# Patient Record
Sex: Female | Born: 1940 | Race: White | Hispanic: No | Smoking: Current every day smoker
Health system: Southern US, Community
[De-identification: ages and names within clinical notes are randomized; demographics above are authoritative.]

## PROBLEM LIST (undated history)

## (undated) DIAGNOSIS — M549 Dorsalgia, unspecified: Secondary | ICD-10-CM

## (undated) DIAGNOSIS — S39012A Strain of muscle, fascia and tendon of lower back, initial encounter: Secondary | ICD-10-CM

## (undated) DIAGNOSIS — M4800 Spinal stenosis, site unspecified: Secondary | ICD-10-CM

## (undated) DIAGNOSIS — J449 Chronic obstructive pulmonary disease, unspecified: Secondary | ICD-10-CM

## (undated) DIAGNOSIS — E785 Hyperlipidemia, unspecified: Secondary | ICD-10-CM

## (undated) DIAGNOSIS — F419 Anxiety disorder, unspecified: Secondary | ICD-10-CM

## (undated) DIAGNOSIS — I714 Abdominal aortic aneurysm, without rupture, unspecified: Secondary | ICD-10-CM

## (undated) HISTORY — PX: ABDOMINAL HYSTERECTOMY: SHX81

## (undated) HISTORY — DX: Dorsalgia, unspecified: M54.9

## (undated) HISTORY — DX: Anxiety disorder, unspecified: F41.9

## (undated) HISTORY — DX: Abdominal aortic aneurysm, without rupture, unspecified: I71.40

## (undated) HISTORY — DX: Strain of muscle, fascia and tendon of lower back, initial encounter: S39.012A

## (undated) HISTORY — DX: Spinal stenosis, site unspecified: M48.00

## (undated) HISTORY — DX: Hyperlipidemia, unspecified: E78.5

## (undated) HISTORY — PX: OTHER SURGICAL HISTORY: SHX169

## (undated) HISTORY — DX: Chronic obstructive pulmonary disease, unspecified: J44.9

## (undated) HISTORY — PX: GALLBLADDER SURGERY: SHX652

## (undated) HISTORY — DX: Abdominal aortic aneurysm, without rupture: I71.4

## (undated) HISTORY — PX: TONSILLECTOMY: SUR1361

---

## 2001-07-17 ENCOUNTER — Encounter: Payer: Self-pay | Admitting: Neurology

## 2001-07-17 ENCOUNTER — Ambulatory Visit (HOSPITAL_COMMUNITY): Admission: RE | Admit: 2001-07-17 | Discharge: 2001-07-17 | Payer: Self-pay | Admitting: Neurology

## 2015-11-24 ENCOUNTER — Other Ambulatory Visit: Payer: Self-pay

## 2015-11-24 ENCOUNTER — Encounter: Payer: Self-pay | Admitting: Surgery

## 2015-11-24 DIAGNOSIS — I739 Peripheral vascular disease, unspecified: Secondary | ICD-10-CM

## 2015-11-26 ENCOUNTER — Telehealth: Payer: Self-pay | Admitting: Vascular Surgery

## 2015-11-26 NOTE — Telephone Encounter (Signed)
-----   Message from Phillips Odorarol S Pullins, RN sent at 11/26/2015 11:28 AM EDT ----- Contact: [phone] This call got sent to Triage; she is referred by Dr. Shelle IronBeane, and is requesting to be seen sooner.  Can you check on a cancellation please.

## 2015-11-26 NOTE — Telephone Encounter (Signed)
dr. Shelle Ironbeane called triage to see if we could get pt in sooner, resched md appt from 5/1 to 4/27, spoke to pt's daughter to inform pt of new appt.

## 2015-12-01 ENCOUNTER — Ambulatory Visit (HOSPITAL_COMMUNITY)
Admission: RE | Admit: 2015-12-01 | Discharge: 2015-12-01 | Disposition: A | Discharge status: Home / Self Care | Payer: Medicare Other | Source: Ambulatory Visit | Attending: Vascular Surgery | Admitting: Vascular Surgery

## 2015-12-01 ENCOUNTER — Other Ambulatory Visit (HOSPITAL_COMMUNITY): Payer: Self-pay | Admitting: Specialist

## 2015-12-01 ENCOUNTER — Other Ambulatory Visit: Payer: Self-pay | Admitting: Surgery

## 2015-12-01 DIAGNOSIS — F419 Anxiety disorder, unspecified: Secondary | ICD-10-CM | POA: Insufficient documentation

## 2015-12-01 DIAGNOSIS — E785 Hyperlipidemia, unspecified: Secondary | ICD-10-CM | POA: Insufficient documentation

## 2015-12-01 DIAGNOSIS — I739 Peripheral vascular disease, unspecified: Secondary | ICD-10-CM

## 2015-12-01 DIAGNOSIS — M79606 Pain in leg, unspecified: Secondary | ICD-10-CM

## 2015-12-01 DIAGNOSIS — R0989 Other specified symptoms and signs involving the circulatory and respiratory systems: Secondary | ICD-10-CM | POA: Diagnosis present

## 2015-12-02 ENCOUNTER — Encounter (HOSPITAL_COMMUNITY): Payer: Medicare Other

## 2015-12-03 ENCOUNTER — Encounter: Payer: Self-pay | Admitting: Surgery

## 2015-12-11 ENCOUNTER — Encounter: Payer: Self-pay | Admitting: Vascular Surgery

## 2015-12-11 ENCOUNTER — Ambulatory Visit (INDEPENDENT_AMBULATORY_CARE_PROVIDER_SITE_OTHER): Payer: Medicare Other | Admitting: Vascular Surgery

## 2015-12-11 VITALS — BP 124/78 | HR 71 | Ht 61.0 in | Wt 133.0 lb

## 2015-12-11 DIAGNOSIS — I714 Abdominal aortic aneurysm, without rupture, unspecified: Secondary | ICD-10-CM

## 2015-12-11 DIAGNOSIS — I739 Peripheral vascular disease, unspecified: Secondary | ICD-10-CM

## 2015-12-11 NOTE — Progress Notes (Signed)
HISTORY AND PHYSICAL     CC:  "I think I have a blood flow problem" Referring Provider:  Dr. Shelle Iron  HPI: This is a 75 y.o. female who states she had pneumonia ~ a year ago.  She states she took the Pneumovax vaccine and got pneumonia again.  She states she had a coughing spell and felt something pull in her back on the left side near her buttocks.  She said over the next few days it got worse.  She has gotten to the point where she is having pain in her buttocks that radiates down her left leg.  She describes this a tingling.   She states that it can happen at any time and continues to get worse.  She states now that she is using a cane with her right hand, it is hurting her right shoulder, which she has had surgery on in the past.  She states that before all this, she did not get any cramping in her legs when walking.  She does not have any non healing wounds.  She states that she has been sent for epidural steroid injections, but she has no relief with this.    She has seen Dr. Shelle Iron and She she does have some lumbar spondylosis.  His note reveals she has an infrarenal AAA and calcification of the right iliac vessels.  He has referred her here for ABI's to check blood flow.  He states that if her ABI's are normal, a CT myelogram can be done. MRI in January 2017 revealed that the AAA measured 4.cm at the maximum diameter and not significantly changed since last exam.  She did have a CT scan ~ 1 year ago revealing a 4cm AAA.  She does not have any abdominal pain.   She states that she does smoke 2ppd of cigarettes.  She really has not tried to quit.    She does take a statin for cholesterol management.  She is on Neurontin.    Past Medical History  Diagnosis Date  . Spinal stenosis   . Lumbar strain   . Anxiety disorder   . COPD (chronic obstructive pulmonary disease) (HCC)   . Hyperlipidemia   . AAA (abdominal aortic aneurysm) [facility])     Past Surgical History  Procedure Laterality Date    . Oophorectomy Left   . Gallbladder surgery    . Abdominal hysterectomy    . Tonsillectomy      No Known Allergies  Current Outpatient Prescriptions  Medication Sig Dispense Refill  . acetaminophen (TYLENOL) 650 MG CR tablet Take 650 mg by mouth every 8 (eight) hours as needed for pain.    . Omega-3 Fatty Acids (FISH OIL) 1000 MG CAPS Take by mouth.    . ALPRAZolam (XANAX) 0.5 MG tablet Take 0.5 mg by mouth at bedtime as needed for anxiety.    . ATORVASTATIN CALCIUM PO Take by mouth.    Marland Kitchen CALCIUM PO Take by mouth.    . Cholecalciferol (VITAMIN D-3 PO) Take by mouth.    . diazepam (VALIUM) 10 MG tablet Take 10 mg by mouth every 6 (six) hours as needed for anxiety.    . dicyclomine (BENTYL) 10 MG capsule Take 10 mg by mouth 4 (four) times daily -  before meals and at bedtime.    . gabapentin (NEURONTIN) 100 MG capsule Take 100 mg by mouth.    . ondansetron (ZOFRAN) 4 MG tablet Take 4 mg by mouth every 8 (eight) hours as  needed for nausea or vomiting.    . ondansetron (ZOFRAN) 8 MG tablet Take 8 mg by mouth every 8 (eight) hours as needed for nausea or vomiting.    . terbinafine (LAMISIL) 250 MG tablet Take 250 mg by mouth daily. Reported on 12/11/2015     No current facility-administered medications for this visit.    Family History  Problem Relation Age of Onset  . Heart disease Daughter     Social History   Social History  . Marital Status: Widowed    Spouse Name: N/A  . Number of Children: N/A  . Years of Education: N/A   Occupational History  . Not on file.   Social History Main Topics  . Smoking status: Current Every Day Smoker -- 2.00 packs/day    Types: Cigarettes  . Smokeless tobacco: Not on file  . Alcohol Use: No  . Drug Use: No  . Sexual Activity: Not on file   Other Topics Concern  . Not on file   Social History Narrative     REVIEW OF SYSTEMS:   [X]  denotes positive finding, [ ]  denotes negative finding Cardiac  Comments:  Chest pain or chest  pressure:    Shortness of breath upon exertion:    Short of breath when lying flat:    Irregular heart rhythm:        Vascular    Pain in calf, thigh, or hip brought on by ambulation: x   Pain in feet at night that wakes you up from your sleep:     Blood clot in your veins:    Leg swelling:         Pulmonary    Oxygen at home:    Productive cough:     COPD/emphysema x   Wheezing:         Neurologic    Sudden weakness in arms or legs:  x   Sudden tingling in left leg    Sudden onset of difficulty speaking or slurred speech:    Temporary loss of vision in one eye:     Problems with dizziness:         Gastrointestinal    Blood in stool:     Vomited blood:         Genitourinary    Burning when urinating:     Blood in urine:        Psychiatric    Major depression:         Hematologic    Bleeding problems:    Problems with blood clotting too easily:        Skin    Rashes or ulcers:        Constitutional    Fever or chills:      PHYSICAL EXAMINATION:  Filed Vitals:   12/11/15 1504  BP: 124/78  Pulse: 71   Body mass index is 25.14 kg/(m^2).  General:  WDWN in NAD; vital signs documented above Gait: Not observed HENT: WNL, normocephalic Pulmonary: normal non-labored breathing , without Rales, rhonchi,  wheezing Cardiac: regular HR, without  Murmurs, rubs or gallops; without carotid bruits Abdomen: soft, NT, no masses Skin: without rashes Vascular Exam/Pulses:  Right Left  Radial 2+ (normal) 2+ (normal)  Ulnar 1+ (weak) 1+ (weak)  Femoral 2+ (normal) 2+ (normal)  Popliteal Unable to palpate  Unable to palpate   DP Unable to palpate  Unable to palpate   PT Unable to palpate  Unable to palpate    Extremities: without  ischemic changes, without Gangrene , without cellulitis; without open wounds;  Musculoskeletal: no muscle wasting or atrophy  Neurologic: A&O X 3;  No focal weakness or paresthesias are detected Psychiatric:  The pt has Normal  affect.   Non-Invasive Vascular Imaging:   ABI's 12/01/15: Right:  1.03 Left:  0.75  Pt meds includes: Statin:  Yes.   Beta Blocker:  No. Aspirin:  No. ACEI:  No. ARB:  No. Other Antiplatelet/Anticoagulant:  No.    ASSESSMENT/PLAN:: 75 y.o. female with left lower back pain that radiates to left leg   -pt's ABI's are 1.03 on the right and 0.75 on the left.  Dr. Darrick Penna does not think this is the source of her buttock pain as she does have 2+ bilateral femoral pulses -she does have a 4cm AAA that has been essentially unchanged over the past year on imaging.  She does not have any abdominal pain.   -she does continue to smoke ~ 2ppd of cigarettes-Dr. Darrick Penna did counsel her on smoking cessation and its importance.   -She is also encouraged to walk 30 minutes daily -she will f/u in 6 months with u/s of AAA and ABI's  -will refer her to Dr. Adella Hare with neurology in [place].   Doreatha Massed, PA-C [facility] [phone]  Clinic MD:  Pt seen and examined in conjunction with Dr. Darrick Penna  History and exam findings are as above. Patient gives a vague history of pain in her left buttocks and left leg when she walks. However this is not reliably reproducible at any certain distance which usually is the case with claudication. She most likely has multifactorial reasons for her left leg pain including her back and some mild peripheral arterial occlusive disease. She does not have any wounds on her feet. She currently has adequate perfusion to her left lower extremity that she is not at risk of limb loss. I discussed with her today walking 30 minutes daily which could improve her back symptoms as well as any possible peripheral arterial disease symptoms. I also discussed with her smoking cessation and greater than 3 minutes they were spent regarding smoking cessation counseling.  The patient also has a known 4 cm abdominal aortic aneurysm which will need to be followed over  time. We will repeat an ultrasound of this in 6 months time. I discussed with the patient and her daughter that a 4 cm abdominal aortic aneurysm has very low risk of rupture and that an intervention at this size is not necessary due to this very low risk. Also discussed with them that we wait risk of rupture versus risk of operation and currently risk of operation outweighs this. I also discussed with the patient that if she is able to quit smoking and walk 30 minutes daily 75% of patients will improve her symptoms with this alone.  The patient will follow-up with me in 6 months time for repeat ultrasound of her abdominal aortic aneurysm as well as ABIs to make sure that she is not getting worse over time.  The patient also requested a referral to Dr. Adella Hare with neurology in [place] who apparently she has seen before. She requested to see Dr. Adella Hare to see if he had any suggestions on how to improve her pain symptoms from a neurologic standpoint.  Fabienne Bruns, MD [facility] [place] Office: [phone] Pager: [phone]

## 2015-12-12 ENCOUNTER — Encounter: Payer: Self-pay | Admitting: Vascular Surgery

## 2015-12-15 ENCOUNTER — Encounter: Payer: Medicare Other | Admitting: Surgery

## 2015-12-15 ENCOUNTER — Encounter (HOSPITAL_COMMUNITY): Payer: Medicare Other

## 2015-12-17 NOTE — Addendum Note (Signed)
Addended by: Sharee PimpleMCCHESNEY, MARILYN K on: 12/17/2015 12:56 PM   Modules accepted: Orders

## 2015-12-31 ENCOUNTER — Ambulatory Visit (INDEPENDENT_AMBULATORY_CARE_PROVIDER_SITE_OTHER): Payer: Medicare Other | Admitting: Neurology

## 2015-12-31 ENCOUNTER — Encounter: Payer: Self-pay | Admitting: Neurology

## 2015-12-31 VITALS — BP 130/63 | HR 67 | Ht 61.0 in | Wt 136.0 lb

## 2015-12-31 DIAGNOSIS — M5442 Lumbago with sciatica, left side: Secondary | ICD-10-CM

## 2015-12-31 DIAGNOSIS — Z13820 Encounter for screening for osteoporosis: Secondary | ICD-10-CM

## 2015-12-31 MED ORDER — FENTANYL 25 MCG/HR TD PT72: 25.0000 ug | MEDICATED_PATCH | TRANSDERMAL | Status: DC

## 2015-12-31 NOTE — Progress Notes (Addendum)
PATIENT: Tiffany Horne DOB: 1940/12/31  Chief Complaint  Patient presents with  . Lumbar Radiculitis    She is here with her daughter, Tiffany Horne.  Reports having low back pain that is radiating into her bilateral legs.  She has [facility] to [place]. She has been evaluated at [facility] by Dr. Shelle Iron.  She had an abnormal MRI and underwent a series of three epidural steroid injections.  The injections were not helpful for her pain.  She has also had a normal vascular exam.  She is currently using hydrocodone 5-325mg , taking 0.5 tablet every four hours then 1.5 tablets at bedtime.     HISTORICAL  Tiffany Horne is a 75 years old ambidextrous female, accompanied by her daughter Tiffany Horne, seen in refer by orthopedic surgeon Dr. Jene Every for evaluation of chronic low back pain, radiating pain to bilateral lower extremity, her primary care physician is nurse practitioner Hal Morales  She suffered severe recurrent pneumonia in the winter of 2016, during that period of time, she coughed violently, in November 75, during one intense coughing spell, she felt instant low back pain, heard a pop in her low back, since then, she noticed worsening low back pain, radiating pain to her left hip, left leg, difficulty walking, difficult to find a comfortable position to sleep, she is now sleeping the seated position, rely on multiple dose of opiates for pain control.  She used to be a Horticulturist, commercial, very active prior to this incident, she was a heavy smoker for more than 30 years,  Over the past few month, she was treated by chiropractor, manipulation has made her low back worse, now she ambulate with a cane, the pain began to radiate into both legs, left worse than right. When she walks with a left ankle dorsi flexion, she felt worsening left-sided low back pain.  She denies neck pain, no bowel and bladder incontinence  I personally reviewed MRI of lumbar and x-ray of lumbar, MRI in January 2017  showed mild lumbar levosclerosis, multilevel degenerative disc disease, with mild left lateral recess stenosis at L3-4, L4-5 level, AAA measured 4.cm at the maximum diameter and not significantly changed since last exam.  Doppler study of bilateral lower extremity April 2017 showed no significant abnormality  REVIEW OF SYSTEMS: Full 14 system review of systems performed and notable only for fatigue, swelling legs, anxiety, not enough sleep, decreased energy, insomnia, restless leg  ALLERGIES: No Known Allergies  HOME MEDICATIONS: Current Outpatient Prescriptions  Medication Sig Dispense Refill  . acetaminophen (TYLENOL) 650 MG CR tablet Take 650 mg by mouth every 8 (eight) hours as needed for pain.    Marland Kitchen ALPRAZolam (XANAX) 0.5 MG tablet Take 0.5 mg by mouth at bedtime as needed for anxiety.    . ATORVASTATIN CALCIUM PO Take by mouth.    Marland Kitchen CALCIUM PO Take by mouth.    . Cholecalciferol (VITAMIN D-3 PO) Take by mouth.    . diazepam (VALIUM) 10 MG tablet Take 10 mg by mouth every 6 (six) hours as needed for anxiety.    . gabapentin (NEURONTIN) 100 MG capsule Take 100 mg by mouth.    Marland Kitchen HYDROcodone-acetaminophen (NORCO/VICODIN) 5-325 MG tablet     . Omega-3 Fatty Acids (FISH OIL) 1000 MG CAPS Take by mouth.    . ondansetron (ZOFRAN) 4 MG tablet Take 4 mg by mouth every 8 (eight) hours as needed for nausea or vomiting.    . ondansetron (ZOFRAN) 8 MG tablet Take  8 mg by mouth every 8 (eight) hours as needed for nausea or vomiting.    . terbinafine (LAMISIL) 250 MG tablet Take 250 mg by mouth daily. Reported on 12/11/2015     No current facility-administered medications for this visit.    PAST MEDICAL HISTORY: Past Medical History  Diagnosis Date  . Spinal stenosis   . Lumbar strain   . Anxiety disorder   . COPD (chronic obstructive pulmonary disease) (HCC)   . Hyperlipidemia   . AAA (abdominal aortic aneurysm) (HCC)   . Back pain     PAST SURGICAL HISTORY: Past Surgical History    Procedure Laterality Date  . Oophorectomy Left   . Gallbladder surgery    . Abdominal hysterectomy    . Tonsillectomy      FAMILY HISTORY: Family History  Problem Relation Age of Onset  . Heart disease Daughter   . Dementia Mother   . Heart attack Father     SOCIAL HISTORY:  Social History   Social History  . Marital Status: Widowed    Spouse Name: N/A  . Number of Children: 3  . Years of Education: 12   Occupational History  . Retired    Social History Main Topics  . Smoking status: Current Every Day Smoker -- 1.50 packs/day    Types: Cigarettes  . Smokeless tobacco: Not on file  . Alcohol Use: 0.0 oz/week    0 Standard drinks or equivalent per week     Comment: Occasionally  . Drug Use: No  . Sexual Activity: Not on file   Other Topics Concern  . Not on file   Social History Narrative   Lives at home alone (daughter often stays with her).   Right-handed.   No caffeine use.     PHYSICAL EXAM   Filed Vitals:   12/31/15 1326  BP: 130/63  Pulse: 67  Height: 5\' 1"  (1.549 m)  Weight: 136 lb (61.689 kg)    Not recorded      Body mass index is 25.71 kg/(m^2).  PHYSICAL EXAMNIATION:  Gen: NAD, conversant, well nourised, obese, well groomed                     Cardiovascular: Regular rate rhythm, no peripheral edema, warm, nontender. Eyes: Conjunctivae clear without exudates or hemorrhage Neck: Supple, no carotid bruise. Pulmonary: Clear to auscultation bilaterally   NEUROLOGICAL EXAM:  MENTAL STATUS: Speech:    Speech is normal; fluent and spontaneous with normal comprehension.  Cognition:     Orientation to time, place and person     Normal recent and remote memory     Normal Attention span and concentration     Normal Language, naming, repeating,spontaneous speech     Fund of knowledge   CRANIAL NERVES: CN II: Visual fields are full to confrontation. Fundoscopic exam is normal with sharp discs and no vascular changes. Pupils are round  equal and briskly reactive to light. CN III, IV, VI: extraocular movement are normal. No ptosis. CN V: Facial sensation is intact to pinprick in all 3 divisions bilaterally. Corneal responses are intact.  CN VII: Face is symmetric with normal eye closure and smile. CN VIII: Hearing is normal to rubbing fingers CN IX, X: Palate elevates symmetrically. Phonation is normal. CN XI: Head turning and shoulder shrug are intact CN XII: Tongue is midline with normal movements and no atrophy.  MOTOR: There is no pronator drift of out-stretched arms. Muscle bulk and tone are normal.  Muscle strength is normal.  REFLEXES: Reflexes are 2+ and symmetric at the biceps, triceps, knees, and ankles. Plantar responses are flexor.  SENSORY: Intact to light touch, pinprick, positional sensation and vibratory sensation are intact in fingers and toes.  COORDINATION: Rapid alternating movements and fine finger movements are intact. There is no dysmetria on finger-to-nose and heel-knee-shin.    GAIT/STANCE: Need assistant to get up from seated position, cautious, antalgic unsteady gait  DIAGNOSTIC DATA (LABS, IMAGING, TESTING) - I reviewed patient records, labs, notes, testing and imaging myself where available.   ASSESSMENT AND PLAN  Margie EgeBarbara A Villela is a 75 y.o. female   Acute worsening of low back pain radiating pain to left leg,   most consistent with a left lumbar radiculopathy  EMG nerve conduction study   she is at high risk for osteoporosis, ordered a bone density scan   we will repeat lumbar x-ray  Fentanyl patch for pain control, breakthrough pain, hydrocodone as needed   Levert FeinsteinYijun Landi Biscardi, M.D. Ph.D.  [facility] [address], [address] [place], [place] [address] Ph: [phone]) [phone] Fax: [phone]   CC: Jene EveryJeffrey Beane, MD, Hal Moralesara G Gunter, NP

## 2016-01-01 ENCOUNTER — Telehealth: Payer: Self-pay | Admitting: *Deleted

## 2016-01-01 ENCOUNTER — Other Ambulatory Visit: Payer: Self-pay | Admitting: *Deleted

## 2016-01-01 MED ORDER — HYDROCODONE-ACETAMINOPHEN 5-325 MG PO TABS: ORAL_TABLET | ORAL | Status: DC

## 2016-01-01 NOTE — Telephone Encounter (Signed)
Spoke to [place] (dgt on HIPPA) who states her mother is in extreme pain.  She just started wearing her first Fentanyl patch less than 12 hours ago.  I explained how Fentanyl patches work and that she would not be experiencing the full benefit of this medication just yet.  Her mother only has two tablets left of her hydrocodone-apap 5-325mg .  Dr. Terrace ArabiaYan has provided a prescription for 30 additional tablets with no refills.  Raynelle FanningJulie is aware the rx must be picked up from our office.  Also, the patient thought she was suppose to stop her gabapentin but Dr. Terrace ArabiaYan would like to her to continue this medication.

## 2016-01-01 NOTE — Telephone Encounter (Signed)
------------------------------------------------------------   Caller JULIE/DAUGHTER             CID [id number]  Patient Tiffany Horne       Pt's Dr Terrace ArabiaYAN          Area Code 336 Phone# [phone] * DOB [phone]      RE SEEN 5/17-HAVING DIFFICULTIES W/FINNIGRAN PATCH   AND IS IN EXTREME PAIN-PLS CB/CANCELLING 6/7 @2 :15   Disp:Y/N N If Y = C/B If No Response In 20minutes ============================================================

## 2016-01-06 ENCOUNTER — Telehealth: Payer: Self-pay | Admitting: Neurology

## 2016-01-06 NOTE — Telephone Encounter (Signed)
The patient has developed leg swelling bilaterally that is also red, painful. She is to see the primary MD about this. I do not think this is related to the fentanyl. This started before the patch was started. She has total body itching that is related to the Opiate medications. She may have to stop the Fentanyl because of this.

## 2016-01-07 ENCOUNTER — Telehealth: Payer: Self-pay | Admitting: Neurology

## 2016-01-07 ENCOUNTER — Telehealth: Payer: Self-pay | Admitting: *Deleted

## 2016-01-07 MED ORDER — HYDROCODONE-ACETAMINOPHEN 5-325 MG PO TABS: ORAL_TABLET | ORAL | Status: AC

## 2016-01-07 NOTE — Telephone Encounter (Signed)
Message For: O/C              (39)     Taken 23-MAY-17 at  7:26PM by [facility]AR Delivered 23-MAY-17 at  7:28PM by JAB to                ------------------------------------------------------------ [name]      CID [id number]  Patient Retia PasseBARBARA Dexter       Pt's Dr Terrace ArabiaYAN          Area Code 336 Phone# [phone] * DOB [phone]    RE LEGS ARE SWELLING UP W/ ITCHING, WORSE SINCE      "FITNOL" PATCHES TREATMENT BEGAN A WEEK AGO          Disp:Y/N Y If Y = C/B If No Response In 20minutes ============================================================

## 2016-01-07 NOTE — Telephone Encounter (Signed)
Daughter Babette Relicammy called to ask if brother Tiffany Horne "Tiffany Horne" could pick up patient's Rx tomorrow morning. He is not listed on DPR form, however he does have Healthcare POA that he can bring in the morning. Please call to advise [phone].

## 2016-01-07 NOTE — Telephone Encounter (Addendum)
Spoke to patient's daughter on HIPPA ([name]) - states her mother's itching has become intolerable even with antihistamines.  Per vo by Dr. Terrace ArabiaYan, discontinue the Fentanyl patches and provide rx for hydrocodone 5-325mg , every 8 hours - postdated from last rx and only enough to last until her appt for NVC/EMG on 01/20/16.  [name] is agreeable to this plan.  Hydrocodone rx placed up front for pick up.

## 2016-01-07 NOTE — Addendum Note (Signed)
Addended by: Lindell SparKIRKMAN, MICHELLE C on: 01/07/2016 04:02 PM   Modules accepted: Orders

## 2016-01-07 NOTE — Telephone Encounter (Addendum)
They will come by tomorrow to update the information. Patient's son, Tiffany Horne, will bring a copy of his healthcare POA to have scanned to patient's chart so he is able to pick up her prescriptions.

## 2016-01-07 NOTE — Telephone Encounter (Signed)
Pt's , Tiffany Horne, daughter called to let Dr. Terrace ArabiaYan know she has been giving pt an anti-histamine medication for all over itching. She wants to know if the dosage for fentaNYL (DURAGESIC) 25 MCG/HR patch could be increased. Pts pain is not being controlled. Pt has appt with her pcp as well. Please call [phone] cell

## 2016-01-07 NOTE — Telephone Encounter (Signed)
Duplicate task.

## 2016-01-09 ENCOUNTER — Telehealth: Payer: Self-pay | Admitting: Neurology

## 2016-01-09 NOTE — Telephone Encounter (Signed)
I was contacted by the radiologist today, the AAA may be significantly larger that noted in April 2017, ? Cause of the back pain. Will get a CT of the Abdomen if OK with the patient. I talked with the daughter, she will discuss with the patient and call me back.

## 2016-01-13 NOTE — Telephone Encounter (Addendum)
Daughter Tiffany Horne, returned Dr. Clarisa KindredWillis's call from Friday, regarding "Aorta has grown 6 cm's since January, was 4.3 in January", daughter asks "is this what's causing her to not be able to walk"?. Please call [phone].

## 2016-01-13 NOTE — Telephone Encounter (Signed)
Reviewed, I will see patient in January 20 2016

## 2016-01-13 NOTE — Addendum Note (Signed)
Addended by: Sharee PimpleMCCHESNEY, MARILYN K on: 01/13/2016 05:18 PM   Modules accepted: Orders

## 2016-01-13 NOTE — Telephone Encounter (Signed)
I called the patient. I talk with the daughter. The radiologist called me about the x-ray the low back, there is some question of enlargement of the abdominal aortic aneurysm. He recommended confirmation with a noncontrast CT scan of the abdomen. The daughter wonders whether they should call Dr. Darrick PennaFields to get his opinion regarding the abdominal CT scan. I think this is quite reasonable, at this point, I'll leave it between the patient and Dr. Darrick PennaFields to decide whether or not to do the abdominal CT.

## 2016-01-14 ENCOUNTER — Telehealth: Payer: Self-pay

## 2016-01-14 DIAGNOSIS — I714 Abdominal aortic aneurysm, without rupture, unspecified: Secondary | ICD-10-CM

## 2016-01-14 NOTE — Telephone Encounter (Signed)
rec'd phone call from pt's daughter.  Reported that the Neurologist ordered an xray of her lumbar spine, and noted that the AAA has increased in size from 4.3 to 4.9 cm.  Requested an appt. with a different vascular surgeon; stated her mother and Dr. Darrick PennaFields did not mesh.  Is requesting an appt. ASAP.  Stated the pt. Has had c/o of back pain for awhile, and has bee to a Chiropracter, an Orthopedist, Dr. Darrick PennaFields in our office, and recently, a Insurance account managereurologist.  Stated her mother can't lay down at night to rest.  Reported has to sleep sitting up, and can only get about 1-2 hrs. Sleep, at a time.  Discussed with Dr. Arbie CookeyEarly.  Recommended an abdominal ultrasound to eval. Size of AAA, and office visit.  Notified pt's daughter that the lumbar spine xray would not give accurate measurement of the AAA, and of Dr. Bosie HelperEarly's recommendation.  Agreed.  Is requesting the appt. be made ASAP.  Reported her mother c/o intermittent abdominal tightening, and then easing up.  Stated she denied abdominal pain associated with the abdominal tightening.  Advised a scheduler will contact her with an appt. For the pt.

## 2016-01-19 ENCOUNTER — Telehealth: Payer: Self-pay | Admitting: Neurology

## 2016-01-19 ENCOUNTER — Telehealth: Payer: Self-pay | Admitting: Vascular Surgery

## 2016-01-19 NOTE — Telephone Encounter (Signed)
-----   Message from Phillips Odorarol S Pullins, RN sent at 01/14/2016  4:30 PM EDT ----- Regarding: RE: AAA options I will call the daughter and reiterate the need to have her to lay down for accurate assessment of the AAA.  I'll let you know what the next step is.Marland Kitchen.Marland Kitchen.Dr. Arbie CookeyEarly agreed that it is either an Abd. US or CTA, and both she would need to lay down.   ----- Message -----    From: Jena GaussMichele A Roczniak    Sent: 01/14/2016   3:39 PM      To: Phillips Odorarol S Pullins, RN Subject: RE: AAA options                                I understand. The images from the spine films would be the only ones we could have.   Office visit only?  ----- Message -----    From: Phillips Odorarol S Pullins, RN    Sent: 01/14/2016   3:18 PM      To: Sallyanne KusterMichele A Roczniak Subject: RE: AAA options                                That is the only way to evaluate the size of the aneurysm; no other options; even with a CTA, the pt. would have to lay down.  It will not be an accurate evaluation of the size, with the pt. sitting up.    ----- Message -----    From: Jena GaussMichele A Roczniak    Sent: 01/14/2016   2:11 PM      To: Smitty Pluckarol S Pullins, RN Subject: AAA options                                    I spoke with the pt's daughter and she told me the pt can not lay down, she can not recline at all. We wouldn't be able to do a AAA study on her.  What are our options?  Thank you, Elon JesterMichele

## 2016-01-19 NOTE — Telephone Encounter (Signed)
The daughter has not discussed sedation with me in the past.  I actually, just [facility], rescheduled her NCV/EMG to be performed here on 01/20/16 by Dr. Terrace ArabiaYan.  I spoke to [name] again - she is concerned about her mother's pain level - she has hydrocodone at home that is currently providing her relief.  They will keep her pending appointment for the test here.

## 2016-01-19 NOTE — Telephone Encounter (Signed)
Sched lab w/ Olegario MessierKathy on 6/6 at 8:00. Sched md 6/13 at 1:45. Spoke to pt's daughter to inform them of appts.

## 2016-01-19 NOTE — Telephone Encounter (Signed)
-----   Message from Phillips Odorarol S Pullins, RN sent at 01/15/2016 11:49 AM EDT ----- Regarding: needs abd U/S and appt. with [facility] Contact: [phone] I spoke with pt's daughter, then asked Dr. Arbie CookeyEarly about poss. sedation for assisting this pt. to lay down for testing.  Per Dr. Arbie CookeyEarly, he didn't feel sedation was the way to go, so he [facility], and she will perform the test with the pt. sitting up.  Please check with her or Juliette AlcideMelinda, in scheduling this test, since she will need to be the one performing it.  Dr. Arbie CookeyEarly has agreed to see her in the office. Thanks for all your effort!

## 2016-01-19 NOTE — Telephone Encounter (Signed)
Pt' daughter called inquiring about r[facility] to [facility]. Sts pt can not lie down for the length of the test and is wanting her to be sedated at the hospital for the test. Pt said she had discussed this with Marcelino DusterMichelle. Please call

## 2016-01-19 NOTE — Telephone Encounter (Signed)
-----   Message from CIGNAKathrine A Comfort, RVT sent at 01/15/2016  4:07 PM EDT ----- Regarding: RE: needs abd U/S and appt. with [facility] Contact: [phone] I am available Tuesday the 6th at 8:00 am.  Janann AugustHelene can scan the DVT study on room 2 and I could scan this patient.  Olegario MessierKathy  ----- Message -----    From: Jena GaussMichele A Roczniak    Sent: 01/15/2016   3:32 PM      To: Caryl PinaKathrine A Comfort, RVT Subject: FW: needs abd U/S and appt. with T[facility]           Hi Olegario MessierKathy,  When would be the best time for you to sch this pt?  ----- Message -----    From: Phillips Odorarol S Pullins, RN    Sent: 01/15/2016  11:49 AM      To: Michele A Roczniak Subject: needs abd U/S and appt. with [facility]               I spoke with pt's daughter, then asked Dr. Arbie CookeyEarly about poss. sedation for assisting this pt. to lay down for testing.  Per Dr. Arbie CookeyEarly, he didn't feel sedation was the way to go, so [facility], and she will perform the test with the pt. sitting up.  Please check with her or Juliette AlcideMelinda, in scheduling this test, since she will need to be the one performing it.  Dr. Arbie CookeyEarly has agreed to see her in the office. Thanks for all your effort!

## 2016-01-20 ENCOUNTER — Ambulatory Visit (INDEPENDENT_AMBULATORY_CARE_PROVIDER_SITE_OTHER): Payer: Medicare Other | Admitting: Neurology

## 2016-01-20 ENCOUNTER — Ambulatory Visit (HOSPITAL_COMMUNITY)
Admission: RE | Admit: 2016-01-20 | Discharge: 2016-01-20 | Disposition: A | Discharge status: Home / Self Care | Payer: Medicare Other | Source: Ambulatory Visit | Attending: Vascular Surgery | Admitting: Vascular Surgery

## 2016-01-20 ENCOUNTER — Ambulatory Visit: Payer: Self-pay | Admitting: Neurology

## 2016-01-20 DIAGNOSIS — E785 Hyperlipidemia, unspecified: Secondary | ICD-10-CM | POA: Insufficient documentation

## 2016-01-20 DIAGNOSIS — F419 Anxiety disorder, unspecified: Secondary | ICD-10-CM | POA: Diagnosis not present

## 2016-01-20 DIAGNOSIS — M5442 Lumbago with sciatica, left side: Secondary | ICD-10-CM

## 2016-01-20 DIAGNOSIS — I714 Abdominal aortic aneurysm, without rupture, unspecified: Secondary | ICD-10-CM

## 2016-01-20 DIAGNOSIS — Z13820 Encounter for screening for osteoporosis: Secondary | ICD-10-CM | POA: Diagnosis not present

## 2016-01-20 MED ORDER — DULOXETINE HCL 30 MG PO CPEP: 60.0000 mg | ORAL_CAPSULE | Freq: Every day | ORAL | Status: AC

## 2016-01-20 NOTE — Progress Notes (Signed)
PATIENT: Tiffany Horne DOB: 01-01-1941  No chief complaint on file.    HISTORICAL  Tiffany Horne is a 75 years old ambidextrous female, accompanied by her daughter Babette Relic, seen in refer by orthopedic surgeon Dr. Jene Every for evaluation of chronic low back pain, radiating pain to bilateral lower extremity, her primary care physician is nurse practitioner Hal Morales  She suffered severe recurrent pneumonia in the winter of 2016, during that period of time, she coughed violently, in November 2016, during one intense coughing spell, she felt instant low back pain, heard a pop in her low back, since then, she noticed worsening low back pain, radiating pain to her left hip, left leg, difficulty walking, difficult to find a comfortable position to sleep, she is now sleeping the seated position, rely on multiple dose of opiates for pain control.  She used to be a Horticulturist, commercial, very active prior to this incident, she was a heavy smoker for more than 30 years,  Over the past few month, she was treated by chiropractor, manipulation has made her low back worse, now she ambulate with a cane, the pain began to radiate into both legs, left worse than right. When she walks with a left ankle dorsi flexion, she felt worsening left-sided low back pain.  She denies neck pain, no bowel and bladder incontinence  I personally reviewed MRI of lumbar and x-ray of lumbar, MRI in January 2017 showed mild lumbar levosclerosis, multilevel degenerative disc disease, with mild left lateral recess stenosis at L3-4, L4-5 level, AAA measured 4.cm at the maximum diameter and not significantly changed since last exam.  Doppler study of bilateral lower extremity April 2017 showed no significant abnormality  UPDATE June 6th 2017: She has tried fentanyl patch 25 g every 3 days, without helping her symptoms, she is now taking hydrocodone/Tylenol 5/325 mg 3 times a day, which was not enough, she could not tolerate  gabapentin 300 mg 3 times a day, cause bilateral lower extremity swelling, which did improve after stopping gabapentin and also diuretic,  She also has difficulty sleeping, is taking Tylenol PM as needed, she is also taking Valium 10 mg as needed every morning for anxiety, this was prescribed by her primary care physician.  I have personally reviewed x-ray in May 2017, there was scoliosis, multilevel degenerative changes, there was no lumbar fracture, there was concern not enlarged aortic aneurysm size, this has led to most recent vascular surgeon Dr. Bosie Helper evaluation, per patient she had a repeat ultrasound of abdomen, I do not have the formal report yet  REVIEW OF SYSTEMS: Full 14 system review of systems performed and notable only for fatigue, swelling legs, anxiety, not enough sleep, decreased energy, insomnia, restless leg  ALLERGIES: No Known Allergies  HOME MEDICATIONS: Current Outpatient Prescriptions  Medication Sig Dispense Refill  . acetaminophen (TYLENOL) 650 MG CR tablet Take 650 mg by mouth every 8 (eight) hours as needed for pain.    Marland Kitchen ALPRAZolam (XANAX) 0.5 MG tablet Take 0.5 mg by mouth at bedtime as needed for anxiety.    . ATORVASTATIN CALCIUM PO Take by mouth.    Marland Kitchen CALCIUM PO Take by mouth.    . Cholecalciferol (VITAMIN D-3 PO) Take by mouth.    . diazepam (VALIUM) 10 MG tablet Take 10 mg by mouth every 6 (six) hours as needed for anxiety.    . fentaNYL (DURAGESIC) 25 MCG/HR patch Place 1 patch (25 mcg total) onto the skin every 3 (three) days.  10 patch 0  . gabapentin (NEURONTIN) 100 MG capsule Take 100 mg by mouth.    Marland Kitchen HYDROcodone-acetaminophen (NORCO/VICODIN) 5-325 MG tablet Take one tablet every 8 hours as needed for pain. 33 tablet 0  . Omega-3 Fatty Acids (FISH OIL) 1000 MG CAPS Take by mouth.    . ondansetron (ZOFRAN) 4 MG tablet Take 4 mg by mouth every 8 (eight) hours as needed for nausea or vomiting.    . ondansetron (ZOFRAN) 8 MG tablet Take 8 mg by mouth  every 8 (eight) hours as needed for nausea or vomiting.    . terbinafine (LAMISIL) 250 MG tablet Take 250 mg by mouth daily. Reported on 12/11/2015     No current facility-administered medications for this visit.    PAST MEDICAL HISTORY: Past Medical History  Diagnosis Date  . Spinal stenosis   . Lumbar strain   . Anxiety disorder   . COPD (chronic obstructive pulmonary disease) (HCC)   . Hyperlipidemia   . AAA (abdominal aortic aneurysm) (HCC)   . Back pain     PAST SURGICAL HISTORY: Past Surgical History  Procedure Laterality Date  . Oophorectomy Left   . Gallbladder surgery    . Abdominal hysterectomy    . Tonsillectomy      FAMILY HISTORY: Family History  Problem Relation Age of Onset  . Heart disease Daughter   . Dementia Mother   . Heart attack Father     SOCIAL HISTORY:  Social History   Social History  . Marital Status: Widowed    Spouse Name: N/A  . Number of Children: 3  . Years of Education: 12   Occupational History  . Retired    Social History Main Topics  . Smoking status: Current Every Day Smoker -- 1.50 packs/day    Types: Cigarettes  . Smokeless tobacco: Not on file  . Alcohol Use: 0.0 oz/week    0 Standard drinks or equivalent per week     Comment: Occasionally  . Drug Use: No  . Sexual Activity: Not on file   Other Topics Concern  . Not on file   Social History Narrative   Lives at home alone (daughter often stays with her).   Right-handed.   No caffeine use.     PHYSICAL EXAM   There were no vitals filed for this visit.  Not recorded      There is no weight on file to calculate BMI.  PHYSICAL EXAMNIATION:  Gen: NAD, conversant, well nourised, obese, well groomed                     Cardiovascular: Regular rate rhythm, no peripheral edema, warm, nontender. Eyes: Conjunctivae clear without exudates or hemorrhage Neck: Supple, no carotid bruise. Pulmonary: Clear to auscultation bilaterally   NEUROLOGICAL  EXAM:  MENTAL STATUS: Speech:    Speech is normal; fluent and spontaneous with normal comprehension.  Cognition:     Orientation to time, place and person     Normal recent and remote memory     Normal Attention span and concentration     Normal Language, naming, repeating,spontaneous speech     Fund of knowledge   CRANIAL NERVES: CN II: Visual fields are full to confrontation. Fundoscopic exam is normal with sharp discs and no vascular changes. Pupils are round equal and briskly reactive to light. CN III, IV, VI: extraocular movement are normal. No ptosis. CN V: Facial sensation is intact to pinprick in all 3 divisions bilaterally. Corneal  responses are intact.  CN VII: Face is symmetric with normal eye closure and smile. CN VIII: Hearing is normal to rubbing fingers CN IX, X: Palate elevates symmetrically. Phonation is normal. CN XI: Head turning and shoulder shrug are intact CN XII: Tongue is midline with normal movements and no atrophy.  MOTOR: There is no pronator drift of out-stretched arms. Muscle bulk and tone are normal. Muscle strength is normal.  REFLEXES: Reflexes are 2+ and symmetric at the biceps, triceps, knees, and ankles. Plantar responses are flexor.  SENSORY: Intact to light touch, pinprick, positional sensation and vibratory sensation are intact in fingers and toes.  COORDINATION: Rapid alternating movements and fine finger movements are intact. There is no dysmetria on finger-to-nose and heel-knee-shin.    GAIT/STANCE: Need assistant to get up from seated position, cautious, antalgic unsteady gait  DIAGNOSTIC DATA (LABS, IMAGING, TESTING) - I reviewed patient records, labs, notes, testing and imaging myself where available.   ASSESSMENT AND PLAN  Margie EgeBarbara A Veiga is a 75 y.o. female   Acute worsening of low back pain radiating pain to left leg,  Electrodiagnostic study today showed no evidence of bilateral lumbar sacral radiculopathy.  I will refer  her to pain management, I have suggested physical therapy, she refused that, could not tolerate due to pain  Add on Cymbalta 60 mg daily Aortic aneurysm  Had a repeat ultrasound of abdomen, followed up by vascular surgeon Dr. Arbie Cookeyearly  Levert FeinsteinYijun Kiyon Fidalgo, M.D. Ph.D.  [facility] [address], [address] [place], [place] [address] Ph: [phone] Fax: [phone]   CC: Jene EveryJeffrey Beane, MD, Hal Moralesara G Gunter, NP

## 2016-01-20 NOTE — Procedures (Signed)
   NCS (NERVE CONDUCTION STUDY) WITH EMG (ELECTROMYOGRAPHY) REPORT   STUDY DATE: January 20 2016 PATIENT NAME: Margie EgeBarbara A Chalmers DOB: 04/14/1941 MRN: [id number]    TECHNOLOGIST: Gearldine ShownLorraine Jones ELECTROMYOGRAPHER: Levert FeinsteinYan, Bubba Vanbenschoten M.D.  CLINICAL INFORMATION:  75 years old female, with scoliosis, complains of acute worsening left-sided low back pain, radiating pain to left lower extremity, gait difficulty  FINDINGS: NERVE CONDUCTION STUDY: Bilateral peroneal sensory responses were normal. Bilateral peroneal to EDB and tibial motor responses were normal. Bilateral tibial H reflexes were normal and symmetric.  NEEDLE ELECTROMYOGRAPHY: Selective needle examinations were performed at bilateral lower extremity muscles and bilateral lumbar sacral paraspinal muscles.  Needle examination of bilateral tibialis anterior, tibialis posterior, peroneal longus, medial gastrocnemius, vastus lateralis, biceps femoris long head were normal.  There was no spontaneous activity at bilateral lumbar sacral paraspinal muscles, bilateral L4-5 S1.  IMPRESSION:   This is a normal study. There is no electrodiagnostic evidence of large fiber peripheral neuropathy, or bilateral lumbar sacral radiculopathy.   INTERPRETING PHYSICIAN:   Levert FeinsteinYan, Mariateresa Batra M.D. Ph.D. [facility] [address], [address] [place], [place] [address] [phone]

## 2016-01-21 ENCOUNTER — Encounter: Payer: Self-pay | Admitting: *Deleted

## 2016-01-21 ENCOUNTER — Encounter: Admitting: Neurology

## 2016-01-21 ENCOUNTER — Telehealth: Payer: Self-pay | Admitting: Neurology

## 2016-01-21 NOTE — Telephone Encounter (Signed)
Pt called sts she is confused if she is to start cymbalta and stop another medication. She does not know the name of the medication. She is a little confused. Please call

## 2016-01-21 NOTE — Telephone Encounter (Signed)
She has stopped using her Fentanyl patch - states it was not controlling her pain.  She was given a prescription for Cymbalta to help with pain.

## 2016-01-22 ENCOUNTER — Encounter: Payer: Self-pay | Admitting: Vascular Surgery

## 2016-01-23 ENCOUNTER — Telehealth: Payer: Self-pay | Admitting: Neurology

## 2016-01-23 NOTE — Telephone Encounter (Signed)
I spoke to pt`s daughter Tiffany Horne after she called answering service with questions about her hydrocode. I advised to give it if patient is in pain and call back on Monday and speak to Dr Terrace ArabiaYan for further guidance .She voiced understanding

## 2016-01-26 ENCOUNTER — Telehealth: Payer: Self-pay | Admitting: Neurology

## 2016-01-26 NOTE — Telephone Encounter (Signed)
Patient is calling and states her mother is having a lot of side affects from the  Rx Cymbalta 30 mg capsules.  She is very fatigued, sleeping a lot, has nausea, etc.  Please call daughter.  Thanks!

## 2016-01-26 NOTE — Telephone Encounter (Signed)
Patient's other daughter Ronnie DossJulie Rollins called and stated the other sister stopped the Cymbalta already. She would also like to be called [phone].

## 2016-01-26 NOTE — Telephone Encounter (Signed)
I called first daughter back, [name]. She states that sister called too. I spoke with [name] and she says that patient did not do well taking Cymbalta. She "did not feel good", had nausea, fatigue, and did not help her symptoms. Patient was only able to take the 30 mg once a day. She stopped taking on 01/25/16. Patient is currently taking her 1/2 tabs of her remaining hydrocodone. [name] is aware that Dr. Terrace ArabiaYan is out of the office today and states that it is ok to address tomorrow.  [name] asks what you suggest to take instead of Cymbalta?  (I also gave [name] the pain clinic # that patient was referred too)

## 2016-01-27 ENCOUNTER — Ambulatory Visit: Admitting: Vascular Surgery

## 2016-01-27 NOTE — Telephone Encounter (Addendum)
Tiffany Horne, please check on patient, I have referred her to pain management, when will be her appointment, it is okay to stop Cymbalta if she could not tolerate it.  The other options would be tramadol as needed, fentanyl patch at higher dose 50 g every 3 days, she tried 25 g every 3 days without helping her symptoms, and NSAIDs as needed such as Celebrex, Mobic,  Long-term pain management should be coordinated by her pain management physician.

## 2016-01-27 NOTE — Telephone Encounter (Signed)
Spoke to [name] - her mother has an appt on 03/16/16 with Dr. Odette FractionPaul Harkins (pain management).  States she has tried and failed Tramadol, Fentanyl 25mcg (tried wearing 2 patches at a time) and NSAIDS.  They are asking if you will provide her with refills of hydrocodone-apap 5-325mg  until her appt with Dr. Ollen BowlHarkins.  In the past, she has taken three tablets daily (every 8 hours).

## 2016-01-28 MED ORDER — FENTANYL 50 MCG/HR TD PT72: 50.0000 ug | MEDICATED_PATCH | TRANSDERMAL | Status: AC

## 2016-01-28 MED ORDER — CELECOXIB 100 MG PO CAPS
100.0000 mg | ORAL_CAPSULE | Freq: Two times a day (BID) | ORAL | Status: AC
Start: 2016-01-28 — End: ?

## 2016-01-28 NOTE — Telephone Encounter (Signed)
Spoke to [place] Dum[place]ie - discussed Dr. Zannie CoveYan's decision with her - patient has Celebrex at the pharmacy.  Rx for Fentanyl 50mcg printed, signed and placed up front for pick up.  Encouraged them to call Dr. Clovis PuHarkin's office frequently for cancellations.

## 2016-01-28 NOTE — Telephone Encounter (Signed)
Please let patient go, I do not feel comfortable continue hydrocodone prescription without clear etiology,  I can write her fentanyl patch 50 g every 3 days, also Celebrex 100 mg twice a day

## 2016-01-28 NOTE — Addendum Note (Signed)
Addended by: Levert FeinsteinYAN, Joss Mcdill on: 01/28/2016 07:54 AM   Modules accepted: Orders

## 2016-01-28 NOTE — Telephone Encounter (Addendum)
Tiffany FanningJulie patient's POA is calling back about the message from yesterday regarding a Rx for HYDROcodone-acetaminophen (NORCO/VICODIN) 5-325 MG tablet for the patient.  Please call the patient at [phone] and discuss.

## 2016-01-28 NOTE — Addendum Note (Signed)
Addended by: Lindell SparKIRKMAN, MICHELLE C on: 01/28/2016 11:43 AM   Modules accepted: Orders

## 2016-01-29 NOTE — Progress Notes (Signed)
Daughter called requesting results of AAA U/A of 6/6.  Pt. Was scheduled to f/u with Dr. Arbie CookeyEarly 6/13, but unable to keep appt. due to illness.  Dr. Arbie CookeyEarly reviewed AAA U/S and advised to tell pt. And daughter that the dimensions of AAA are stable, (4.2 cm x 4.0 cm) and that the LS Spine film dimensions were not accurate; recommended a 1 yr. F/u with AAA U/S and office appt.  Phone call to daughter.  Gave explanation/ recommendations by Dr. Arbie CookeyEarly.  Daughter verb. Understanding.  Reported pt. Continues to have abdominal pain and nausea, and [facility]. Stated the pt. was found to have a "small nodule in her stomach", but that the doctor didn't feel it was contributing to her symptoms.  Stated she was diagnosed with Neuropathy in her legs, was [facility] home.  Advised daughter, she will be notified of the 1 year f/u appt.., as that time frame nears.  Verb. Understanding.

## 2016-02-03 ENCOUNTER — Other Ambulatory Visit (HOSPITAL_COMMUNITY)

## 2016-02-03 ENCOUNTER — Ambulatory Visit: Admitting: Vascular Surgery

## 2016-02-11 ENCOUNTER — Inpatient Hospital Stay
Admission: AD | Admit: 2016-02-11 | Discharge: 2016-03-27 | Payer: Medicare Other | Source: Ambulatory Visit | Attending: Internal Medicine | Admitting: Internal Medicine

## 2016-02-11 ENCOUNTER — Other Ambulatory Visit (HOSPITAL_COMMUNITY): Payer: Self-pay

## 2016-02-11 ENCOUNTER — Inpatient Hospital Stay: Admit: 2016-02-11 | Payer: Self-pay | Admitting: Internal Medicine

## 2016-02-11 DIAGNOSIS — R1310 Dysphagia, unspecified: Secondary | ICD-10-CM

## 2016-02-11 DIAGNOSIS — J189 Pneumonia, unspecified organism: Secondary | ICD-10-CM

## 2016-02-11 DIAGNOSIS — R52 Pain, unspecified: Secondary | ICD-10-CM

## 2016-02-11 DIAGNOSIS — Z4659 Encounter for fitting and adjustment of other gastrointestinal appliance and device: Secondary | ICD-10-CM

## 2016-02-11 DIAGNOSIS — Z452 Encounter for adjustment and management of vascular access device: Secondary | ICD-10-CM

## 2016-02-11 DIAGNOSIS — J9600 Acute respiratory failure, unspecified whether with hypoxia or hypercapnia: Secondary | ICD-10-CM

## 2016-02-11 DIAGNOSIS — T8579XA Infection and inflammatory reaction due to other internal prosthetic devices, implants and grafts, initial encounter: Secondary | ICD-10-CM

## 2016-02-11 DIAGNOSIS — Z931 Gastrostomy status: Secondary | ICD-10-CM

## 2016-02-11 DIAGNOSIS — J9690 Respiratory failure, unspecified, unspecified whether with hypoxia or hypercapnia: Secondary | ICD-10-CM

## 2016-02-11 DIAGNOSIS — Z01818 Encounter for other preprocedural examination: Secondary | ICD-10-CM

## 2016-02-11 DIAGNOSIS — Z431 Encounter for attention to gastrostomy: Secondary | ICD-10-CM

## 2016-02-11 LAB — VANCOMYCIN, RANDOM: Vancomycin Rm: 13

## 2016-02-11 NOTE — H&P (Deleted)
[facility]                                                                                        History and Physical Note      Patient Demographics  Tiffany Horne, is a 75 y.o. female  CSN: [id number]  MRN: [id number]  DOB - 07/16/1941  Admit Date - 02/11/2016  Outpatient Primary MD for the patient is Hal MoralesGunter, Tara G, NP   With History of -  Past Medical History  Diagnosis Date  . Spinal stenosis   . Lumbar strain   . Anxiety disorder   . COPD (chronic obstructive pulmonary disease) (HCC)   . Hyperlipidemia   . AAA (abdominal aortic aneurysm) (HCC)   . Back pain       Past Surgical History  Procedure Laterality Date  . Oophorectomy Left   . Gallbladder surgery    . Abdominal hysterectomy    . Tonsillectomy       HPI  Tiffany Horne  is a 75 y.o. female, With past medical history significant for neuropathy, hyperlipidemia and chronic abdominal pain who was admitted to the referring hospital on 01/31/16 for septic shock secondary to incarcerated small bowel obstruction. The patient was intubated in the emergency room and underwent exploratory laparotomy with small bowel resection 2 and anastomosis the patient had celiotomy and her stay was complicated by Nirali Magouirk secondary to aspiration pneumonitis she was extubated on 6/25 2017 and required BiPAP support due to history of obstructive sleep apnea and weakness. Patient developed critical illness polymyopathy and was started on NG tube feeding with vital 1.2 per tube and transferred to our hospital for further management   Review of Systems    Could not be obtained due to patient's situation   Social History Social History  Substance Use Topics  . Smoking status: Current Every Day Smoker -- 1.50 packs/day    Types: Cigarettes  . Smokeless tobacco: Not on file  . Alcohol Use: 0.0 oz/week    0 Standard drinks or equivalent per week     Comment:  Occasionally     Family History Family History  Problem Relation Age of Onset  . Heart disease Daughter   . Dementia Mother   . Heart attack Father      Prior to Admission medications   Medication Sig Start Date End Date Taking? Authorizing Provider  acetaminophen (TYLENOL) 650 MG CR tablet Take 650 mg by mouth every 8 (eight) hours as needed for pain.    Historical Provider, MD  ALPRAZolam Prudy Feeler(XANAX) 0.5 MG tablet Take 0.5 mg by mouth at bedtime as needed for anxiety.    Historical Provider, MD  ATORVASTATIN CALCIUM PO Take by mouth.    Historical Provider, MD  CALCIUM PO Take by mouth.    Historical Provider, MD  celecoxib (CELEBREX) 100 MG capsule Take 1 capsule (100 mg total) by mouth 2 (two) times daily. 01/28/16   Levert FeinsteinYijun Yan, MD  Cholecalciferol (VITAMIN D-3 PO) Take by mouth.    Historical Provider, MD  diazepam (VALIUM) 10 MG tablet Take 10 mg by mouth every 6 (six) hours as needed for anxiety.  Historical Provider, MD  DULoxetine (CYMBALTA) 30 MG capsule Take 2 capsules (60 mg total) by mouth daily. 01/20/16   Levert FeinsteinYijun Yan, MD  fentaNYL (DURAGESIC - DOSED MCG/HR) 50 MCG/HR Place 1 patch (50 mcg total) onto the skin every 3 (three) days. 01/28/16   Levert FeinsteinYijun Yan, MD  gabapentin (NEURONTIN) 100 MG capsule Take 100 mg by mouth.    Historical Provider, MD  HYDROcodone-acetaminophen (NORCO/VICODIN) 5-325 MG tablet Take one tablet every 8 hours as needed for pain. 01/07/16   Asa Lenteichard A Sater, MD  Omega-3 Fatty Acids (FISH OIL) 1000 MG CAPS Take by mouth.    Historical Provider, MD  ondansetron (ZOFRAN) 4 MG tablet Take 4 mg by mouth every 8 (eight) hours as needed for nausea or vomiting.    Historical Provider, MD  ondansetron (ZOFRAN) 8 MG tablet Take 8 mg by mouth every 8 (eight) hours as needed for nausea or vomiting.    Historical Provider, MD  terbinafine (LAMISIL) 250 MG tablet Take 250 mg by mouth daily. Reported on 12/11/2015    Historical Provider, MD    No Known Allergies  Physical  Exam  Vitals  There were no vitals taken for this visit.   1. General Elderly female looks chronically ill  2. Normal affect and insight, confused  3. No F.N deficits, grossly, patient moving all extremities  4. Ears and Eyes appear Normal, Conjunctivae clear, PERRLA. Moist Oral Mucosa. NG tube in place  5. Supple Neck, No JVD, No cervical lymphadenopathy appriciated, No Carotid Bruits.  6. Symmetrical Chest wall movement, decreased breath sounds bilaterally.  7. RRR, positive for murmur 3/6 on the fifth intercostal space, No Parasternal Heave.  8. Positive Bowel Sounds, surgical wound looks clean at the midabdomen around 25 cm long  9.  No Cyanosis, Normal Skin Turgor, No Skin Rash or Bruise.  10. ,  joints appear normal , no effusions, Normal ROM.       NG tube/rectal tube/Foley noted  Data Review  None to be reviewed at this time  Assessment   1. Ischemic necrosis of small bowel status post exploratory laparotomy and celiotomy/small bowel resection 2. Acute lung injury, status post extubation on BiPAP daily at bedtime 3. Abdominal aortic aneurysm 4. COPD/obstructive sleep apnea 5. Malnutrition on vital 1.5 per NG tube/dysphagia 6. Generalized weakness complicated by previous polyneuropathy 7. History of spinal stenosis 8. History of anxiety  Plan Will admit patient to our facility Continue medications per referring hospital for now Check labs in a.m. Consult PT/OT/registered dietitian and speech therapist Continue tube feeds per NG Pain control    DVT Prophylaxis see medication sheet  AM Labs Ordered, also please review Full Orders  Code Status full  Disposition Plan: SNF  Time spent in minutes : 35 minutes  Condition GUARDED   [name] M.D on 02/11/2016 at 3:47 PM

## 2016-02-12 ENCOUNTER — Other Ambulatory Visit (HOSPITAL_COMMUNITY): Payer: Self-pay

## 2016-02-12 LAB — POTASSIUM: POTASSIUM: 2.9 mmol/L — AB (ref 3.5–5.1)

## 2016-02-12 LAB — C DIFFICILE QUICK SCREEN W PCR REFLEX
C Diff antigen: NEGATIVE
C Diff interpretation: NEGATIVE
C Diff toxin: NEGATIVE

## 2016-02-12 LAB — COMPREHENSIVE METABOLIC PANEL
ALK PHOS: 49 U/L (ref 38–126)
ALT: 29 U/L (ref 14–54)
ANION GAP: 9 (ref 5–15)
AST: 31 U/L (ref 15–41)
Albumin: 1.9 g/dL — ABNORMAL LOW (ref 3.5–5.0)
BILIRUBIN TOTAL: 0.7 mg/dL (ref 0.3–1.2)
BUN: 20 mg/dL (ref 6–20)
CALCIUM: 8.2 mg/dL — AB (ref 8.9–10.3)
CO2: 35 mmol/L — ABNORMAL HIGH (ref 22–32)
Chloride: 109 mmol/L (ref 101–111)
Creatinine, Ser: 0.52 mg/dL (ref 0.44–1.00)
GFR calc non Af Amer: >60 mL/min (ref >60)
Glucose, Bld: 111 mg/dL — ABNORMAL HIGH (ref 65–99)
POTASSIUM: 2.3 mmol/L — AB (ref 3.5–5.1)
SODIUM: 153 mmol/L — AB (ref 135–145)
TOTAL PROTEIN: 5.8 g/dL — AB (ref 6.5–8.1)

## 2016-02-12 LAB — CBC
HEMATOCRIT: 28.9 % — AB (ref 36.0–46.0)
Hemoglobin: 8.8 g/dL — ABNORMAL LOW (ref 12.0–15.0)
MCH: 31.7 pg (ref 26.0–34.0)
MCHC: 30.4 g/dL (ref 30.0–36.0)
MCV: 104 fL — ABNORMAL HIGH (ref 78.0–100.0)
PLATELETS: 321 10*3/uL (ref 150–400)
RBC: 2.78 MIL/uL — ABNORMAL LOW (ref 3.87–5.11)
RDW: 14 % (ref 11.5–15.5)
WBC: 10.7 10*3/uL — ABNORMAL HIGH (ref 4.0–10.5)

## 2016-02-12 LAB — PROTIME-INR
INR: 1.43 (ref 0.00–1.49)
PROTHROMBIN TIME: 17.6 s — AB (ref 11.6–15.2)

## 2016-02-13 LAB — BASIC METABOLIC PANEL
ANION GAP: 8 (ref 5–15)
BUN: 15 mg/dL (ref 6–20)
CHLORIDE: 104 mmol/L (ref 101–111)
CO2: 32 mmol/L (ref 22–32)
Calcium: 8 mg/dL — ABNORMAL LOW (ref 8.9–10.3)
Creatinine, Ser: 0.48 mg/dL (ref 0.44–1.00)
Glucose, Bld: 136 mg/dL — ABNORMAL HIGH (ref 65–99)
POTASSIUM: 2.7 mmol/L — AB (ref 3.5–5.1)
SODIUM: 144 mmol/L (ref 135–145)

## 2016-02-13 LAB — POTASSIUM: POTASSIUM: 3.7 mmol/L (ref 3.5–5.1)

## 2016-02-14 LAB — BASIC METABOLIC PANEL
ANION GAP: 8 (ref 5–15)
BUN: 12 mg/dL (ref 6–20)
CALCIUM: 8.5 mg/dL — AB (ref 8.9–10.3)
CO2: 29 mmol/L (ref 22–32)
Chloride: 108 mmol/L (ref 101–111)
Creatinine, Ser: 0.48 mg/dL (ref 0.44–1.00)
GFR calc Af Amer: >60 mL/min (ref >60)
GFR calc non Af Amer: >60 mL/min (ref >60)
GLUCOSE: 122 mg/dL — AB (ref 65–99)
POTASSIUM: 3.6 mmol/L (ref 3.5–5.1)
SODIUM: 145 mmol/L (ref 135–145)

## 2016-02-16 LAB — C DIFFICILE QUICK SCREEN W PCR REFLEX
C DIFFICLE (CDIFF) ANTIGEN: NEGATIVE
C Diff interpretation: NEGATIVE
C Diff toxin: NEGATIVE

## 2016-02-16 LAB — CBC
HCT: 30.5 % — ABNORMAL LOW (ref 36.0–46.0)
Hemoglobin: 9.1 g/dL — ABNORMAL LOW (ref 12.0–15.0)
MCH: 31.2 pg (ref 26.0–34.0)
MCHC: 29.8 g/dL — ABNORMAL LOW (ref 30.0–36.0)
MCV: 104.5 fL — ABNORMAL HIGH (ref 78.0–100.0)
PLATELETS: 450 10*3/uL — AB (ref 150–400)
RBC: 2.92 MIL/uL — AB (ref 3.87–5.11)
RDW: 15.1 % (ref 11.5–15.5)
WBC: 16.6 10*3/uL — AB (ref 4.0–10.5)

## 2016-02-16 LAB — BASIC METABOLIC PANEL
ANION GAP: 6 (ref 5–15)
BUN: 20 mg/dL (ref 6–20)
CALCIUM: 8.4 mg/dL — AB (ref 8.9–10.3)
CO2: 30 mmol/L (ref 22–32)
Chloride: 111 mmol/L (ref 101–111)
Creatinine, Ser: 0.47 mg/dL (ref 0.44–1.00)
GFR calc Af Amer: >60 mL/min (ref >60)
Glucose, Bld: 163 mg/dL — ABNORMAL HIGH (ref 65–99)
POTASSIUM: 3.5 mmol/L (ref 3.5–5.1)
SODIUM: 147 mmol/L — AB (ref 135–145)

## 2016-02-17 ENCOUNTER — Other Ambulatory Visit (HOSPITAL_COMMUNITY): Payer: Self-pay

## 2016-02-17 LAB — CBC
HCT: 33.3 % — ABNORMAL LOW (ref 36.0–46.0)
Hemoglobin: 10 g/dL — ABNORMAL LOW (ref 12.0–15.0)
MCH: 31.7 pg (ref 26.0–34.0)
MCHC: 30 g/dL (ref 30.0–36.0)
MCV: 105.7 fL — AB (ref 78.0–100.0)
PLATELETS: 456 10*3/uL — AB (ref 150–400)
RBC: 3.15 MIL/uL — ABNORMAL LOW (ref 3.87–5.11)
RDW: 15.3 % (ref 11.5–15.5)
WBC: 16.7 10*3/uL — AB (ref 4.0–10.5)

## 2016-02-17 MED ORDER — IOPAMIDOL (ISOVUE-300) INJECTION 61%
INTRAVENOUS | Status: AC
  Administered 2016-02-17: 100 mL
  Filled 2016-02-17: qty 100

## 2016-02-18 LAB — CBC
HCT: 34.1 % — ABNORMAL LOW (ref 36.0–46.0)
HEMOGLOBIN: 10.1 g/dL — AB (ref 12.0–15.0)
MCH: 32 pg (ref 26.0–34.0)
MCHC: 29.6 g/dL — AB (ref 30.0–36.0)
MCV: 107.9 fL — ABNORMAL HIGH (ref 78.0–100.0)
PLATELETS: 458 10*3/uL — AB (ref 150–400)
RBC: 3.16 MIL/uL — ABNORMAL LOW (ref 3.87–5.11)
RDW: 15.3 % (ref 11.5–15.5)
WBC: 20.6 10*3/uL — ABNORMAL HIGH (ref 4.0–10.5)

## 2016-02-18 LAB — BASIC METABOLIC PANEL
Anion gap: 11 (ref 5–15)
BUN: 33 mg/dL — ABNORMAL HIGH (ref 6–20)
CALCIUM: 8.9 mg/dL (ref 8.9–10.3)
CHLORIDE: 106 mmol/L (ref 101–111)
CO2: 28 mmol/L (ref 22–32)
CREATININE: 0.9 mg/dL (ref 0.44–1.00)
Glucose, Bld: 141 mg/dL — ABNORMAL HIGH (ref 65–99)
Potassium: 4.1 mmol/L (ref 3.5–5.1)
SODIUM: 145 mmol/L (ref 135–145)

## 2016-02-20 LAB — CBC
HEMATOCRIT: 34.8 % — AB (ref 36.0–46.0)
HEMOGLOBIN: 10.5 g/dL — AB (ref 12.0–15.0)
MCH: 31.8 pg (ref 26.0–34.0)
MCHC: 30.2 g/dL (ref 30.0–36.0)
MCV: 105.5 fL — ABNORMAL HIGH (ref 78.0–100.0)
Platelets: 356 10*3/uL (ref 150–400)
RBC: 3.3 MIL/uL — AB (ref 3.87–5.11)
RDW: 15.2 % (ref 11.5–15.5)
WBC: 18.2 10*3/uL — ABNORMAL HIGH (ref 4.0–10.5)

## 2016-02-21 ENCOUNTER — Other Ambulatory Visit (HOSPITAL_COMMUNITY): Payer: Self-pay

## 2016-02-23 ENCOUNTER — Other Ambulatory Visit (HOSPITAL_COMMUNITY): Payer: Self-pay

## 2016-02-27 LAB — COMPREHENSIVE METABOLIC PANEL
ALK PHOS: 84 U/L (ref 38–126)
ALT: 54 U/L (ref 14–54)
ANION GAP: 14 (ref 5–15)
AST: 38 U/L (ref 15–41)
Albumin: 2.7 g/dL — ABNORMAL LOW (ref 3.5–5.0)
BUN: 79 mg/dL — ABNORMAL HIGH (ref 6–20)
CHLORIDE: 111 mmol/L (ref 101–111)
CO2: 22 mmol/L (ref 22–32)
Calcium: 9.4 mg/dL (ref 8.9–10.3)
Creatinine, Ser: 0.96 mg/dL (ref 0.44–1.00)
GFR calc non Af Amer: 56 mL/min — ABNORMAL LOW (ref >60)
GLUCOSE: 106 mg/dL — AB (ref 65–99)
POTASSIUM: 6 mmol/L — AB (ref 3.5–5.1)
Sodium: 147 mmol/L — ABNORMAL HIGH (ref 135–145)
Total Bilirubin: 0.7 mg/dL (ref 0.3–1.2)
Total Protein: 7.1 g/dL (ref 6.5–8.1)

## 2016-02-27 LAB — CBC
HEMATOCRIT: 44.2 % (ref 36.0–46.0)
Hemoglobin: 13.9 g/dL (ref 12.0–15.0)
MCH: 32.7 pg (ref 26.0–34.0)
MCHC: 31.4 g/dL (ref 30.0–36.0)
MCV: 104 fL — AB (ref 78.0–100.0)
Platelets: 176 10*3/uL (ref 150–400)
RBC: 4.25 MIL/uL (ref 3.87–5.11)
RDW: 15.8 % — AB (ref 11.5–15.5)
WBC: 14.8 10*3/uL — AB (ref 4.0–10.5)

## 2016-02-28 ENCOUNTER — Other Ambulatory Visit (HOSPITAL_COMMUNITY): Payer: Self-pay

## 2016-02-28 LAB — BLOOD GAS, ARTERIAL
ACID-BASE DEFICIT: 5.2 mmol/L — AB (ref 0.0–2.0)
BICARBONATE: 18.6 meq/L — AB (ref 20.0–24.0)
O2 CONTENT: 5 L/min
O2 SAT: 98.6 %
PATIENT TEMPERATURE: 98.6
PH ART: 7.41 (ref 7.350–7.450)
TCO2: 19.5 mmol/L (ref 0–100)
pCO2 arterial: 29.9 mmHg — ABNORMAL LOW (ref 35.0–45.0)
pO2, Arterial: 137 mmHg — ABNORMAL HIGH (ref 80.0–100.0)

## 2016-02-28 LAB — CBC WITH DIFFERENTIAL/PLATELET
Basophils Absolute: 0 10*3/uL (ref 0.0–0.1)
Basophils Relative: 0 %
EOS ABS: 0 10*3/uL (ref 0.0–0.7)
EOS PCT: 0 %
HCT: 38.4 % (ref 36.0–46.0)
Hemoglobin: 11.6 g/dL — ABNORMAL LOW (ref 12.0–15.0)
LYMPHS ABS: 1.1 10*3/uL (ref 0.7–4.0)
Lymphocytes Relative: 7 %
MCH: 31.5 pg (ref 26.0–34.0)
MCHC: 30.2 g/dL (ref 30.0–36.0)
MCV: 104.3 fL — ABNORMAL HIGH (ref 78.0–100.0)
MONO ABS: 0.8 10*3/uL (ref 0.1–1.0)
MONOS PCT: 5 %
Neutro Abs: 14.7 10*3/uL — ABNORMAL HIGH (ref 1.7–7.7)
Neutrophils Relative %: 88 %
PLATELETS: 136 10*3/uL — AB (ref 150–400)
RBC: 3.68 MIL/uL — ABNORMAL LOW (ref 3.87–5.11)
RDW: 15.5 % (ref 11.5–15.5)
WBC: 16.6 10*3/uL — AB (ref 4.0–10.5)

## 2016-02-28 LAB — BASIC METABOLIC PANEL
ANION GAP: 16 — AB (ref 5–15)
Anion gap: 12 (ref 5–15)
BUN: 114 mg/dL — AB (ref 6–20)
BUN: 101 mg/dL — AB (ref 6–20)
CHLORIDE: 115 mmol/L — AB (ref 101–111)
CO2: 19 mmol/L — ABNORMAL LOW (ref 22–32)
CO2: 24 mmol/L (ref 22–32)
CREATININE: 2.18 mg/dL — AB (ref 0.44–1.00)
Calcium: 9.5 mg/dL (ref 8.9–10.3)
Calcium: 10 mg/dL (ref 8.9–10.3)
Chloride: 119 mmol/L — ABNORMAL HIGH (ref 101–111)
Creatinine, Ser: 2.28 mg/dL — ABNORMAL HIGH (ref 0.44–1.00)
GFR calc Af Amer: 23 mL/min — ABNORMAL LOW (ref >60)
GFR calc Af Amer: 24 mL/min — ABNORMAL LOW (ref >60)
GFR, EST NON AFRICAN AMERICAN: 20 mL/min — AB (ref >60)
GFR, EST NON AFRICAN AMERICAN: 21 mL/min — AB (ref >60)
GLUCOSE: 271 mg/dL — AB (ref 65–99)
Glucose, Bld: 140 mg/dL — ABNORMAL HIGH (ref 65–99)
Potassium: 4.6 mmol/L (ref 3.5–5.1)
SODIUM: 150 mmol/L — AB (ref 135–145)
SODIUM: 155 mmol/L — AB (ref 135–145)

## 2016-02-28 LAB — TROPONIN I: Troponin I: 0.11 ng/mL (ref <0.03)

## 2016-02-29 LAB — BASIC METABOLIC PANEL
Anion gap: 7 (ref 5–15)
BUN: 74 mg/dL — AB (ref 6–20)
CHLORIDE: 120 mmol/L — AB (ref 101–111)
CO2: 27 mmol/L (ref 22–32)
CREATININE: 1.08 mg/dL — AB (ref 0.44–1.00)
Calcium: 8.7 mg/dL — ABNORMAL LOW (ref 8.9–10.3)
GFR, EST AFRICAN AMERICAN: 57 mL/min — AB (ref >60)
GFR, EST NON AFRICAN AMERICAN: 49 mL/min — AB (ref >60)
Glucose, Bld: 100 mg/dL — ABNORMAL HIGH (ref 65–99)
POTASSIUM: 3 mmol/L — AB (ref 3.5–5.1)
SODIUM: 154 mmol/L — AB (ref 135–145)

## 2016-02-29 LAB — TROPONIN I: TROPONIN I: 0.09 ng/mL — AB (ref <0.03)

## 2016-03-01 ENCOUNTER — Other Ambulatory Visit (HOSPITAL_COMMUNITY): Payer: Self-pay

## 2016-03-01 LAB — CBC
HEMATOCRIT: 30.1 % — AB (ref 36.0–46.0)
HEMOGLOBIN: 9.1 g/dL — AB (ref 12.0–15.0)
MCH: 31.2 pg (ref 26.0–34.0)
MCHC: 30.2 g/dL (ref 30.0–36.0)
MCV: 103.1 fL — ABNORMAL HIGH (ref 78.0–100.0)
PLATELETS: 83 10*3/uL — AB (ref 150–400)
RBC: 2.92 MIL/uL — ABNORMAL LOW (ref 3.87–5.11)
RDW: 15.4 % (ref 11.5–15.5)
WBC: 12.8 10*3/uL — ABNORMAL HIGH (ref 4.0–10.5)

## 2016-03-01 LAB — BASIC METABOLIC PANEL
ANION GAP: 6 (ref 5–15)
BUN: 54 mg/dL — ABNORMAL HIGH (ref 6–20)
CALCIUM: 7.4 mg/dL — AB (ref 8.9–10.3)
CO2: 24 mmol/L (ref 22–32)
Chloride: 111 mmol/L (ref 101–111)
Creatinine, Ser: 0.84 mg/dL (ref 0.44–1.00)
GLUCOSE: 205 mg/dL — AB (ref 65–99)
POTASSIUM: 3 mmol/L — AB (ref 3.5–5.1)
Sodium: 141 mmol/L (ref 135–145)

## 2016-03-02 ENCOUNTER — Encounter (HOSPITAL_COMMUNITY): Payer: Self-pay

## 2016-03-02 LAB — CBC
HEMATOCRIT: 23.4 % — AB (ref 36.0–46.0)
Hemoglobin: 7.5 g/dL — ABNORMAL LOW (ref 12.0–15.0)
MCH: 31.9 pg (ref 26.0–34.0)
MCHC: 32.1 g/dL (ref 30.0–36.0)
MCV: 99.6 fL (ref 78.0–100.0)
Platelets: 56 10*3/uL — ABNORMAL LOW (ref 150–400)
RBC: 2.35 MIL/uL — ABNORMAL LOW (ref 3.87–5.11)
RDW: 14.8 % (ref 11.5–15.5)
WBC: 7.4 10*3/uL (ref 4.0–10.5)

## 2016-03-02 LAB — BASIC METABOLIC PANEL
ANION GAP: 7 (ref 5–15)
BUN: 18 mg/dL (ref 6–20)
CALCIUM: 6.7 mg/dL — AB (ref 8.9–10.3)
CO2: 21 mmol/L — AB (ref 22–32)
Chloride: 102 mmol/L (ref 101–111)
Creatinine, Ser: 0.73 mg/dL (ref 0.44–1.00)
GFR calc Af Amer: >60 mL/min (ref >60)
GFR calc non Af Amer: >60 mL/min (ref >60)
GLUCOSE: 340 mg/dL — AB (ref 65–99)
Potassium: 2.7 mmol/L — CL (ref 3.5–5.1)
Sodium: 130 mmol/L — ABNORMAL LOW (ref 135–145)

## 2016-03-02 LAB — MAGNESIUM: Magnesium: 1.5 mg/dL — ABNORMAL LOW (ref 1.7–2.4)

## 2016-03-02 LAB — PROTIME-INR
INR: 1.39 (ref 0.00–1.49)
Prothrombin Time: 17.2 seconds — ABNORMAL HIGH (ref 11.6–15.2)

## 2016-03-02 NOTE — Consult Note (Signed)
Chief Complaint: Patient was seen in consultation today for percutaneous gastric tube placement at the request of Dr Carron Curie  Referring Physician(s): Dr Ardeth Sportsman  Supervising Physician: Gilmer Mor  Patient Status: Inpatient  History of Present Illness: Tiffany Horne is a 75 y.o. female   Admitted in septic shock after small bowel incarceration/obstruction Exp lap and small bowel resection x 2 Respiratory failure and aspiration pneumonia Now off vent Non verbal Aspiration risk/dysphagia Malnutrition Need for long term care Request made for percutaneous gastric tube placement Imaging has been reviewed and approved by Dr Loreta Ave  [facility]bc slighly hi secondary Prednisone afeb plts 56 today- thrombocytopenia   Past Medical History  Diagnosis Date  . Spinal stenosis   . Lumbar strain   . Anxiety disorder   . COPD (chronic obstructive pulmonary disease) (HCC)   . Hyperlipidemia   . AAA (abdominal aortic aneurysm) (HCC)   . Back pain     Past Surgical History  Procedure Laterality Date  . Oophorectomy Left   . Gallbladder surgery    . Abdominal hysterectomy    . Tonsillectomy      Allergies: Review of patient's allergies indicates no known allergies.  Medications: Prior to Admission medications   Medication Sig Start Date End Date Taking? Authorizing Provider  acetaminophen (TYLENOL) 650 MG CR tablet Take 650 mg by mouth every 8 (eight) hours as needed for pain.    Historical Provider, MD  ALPRAZolam Prudy Feeler) 0.5 MG tablet Take 0.5 mg by mouth at bedtime as needed for anxiety.    Historical Provider, MD  ATORVASTATIN CALCIUM PO Take by mouth.    Historical Provider, MD  CALCIUM PO Take by mouth.    Historical Provider, MD  celecoxib (CELEBREX) 100 MG capsule Take 1 capsule (100 mg total) by mouth 2 (two) times daily. 01/28/16   Levert Feinstein, MD  Cholecalciferol (VITAMIN D-3 PO) Take by mouth.    Historical Provider, MD  diazepam (VALIUM) 10 MG tablet Take 10 mg  by mouth every 6 (six) hours as needed for anxiety.    Historical Provider, MD  DULoxetine (CYMBALTA) 30 MG capsule Take 2 capsules (60 mg total) by mouth daily. 01/20/16   Levert Feinstein, MD  fentaNYL (DURAGESIC - DOSED MCG/HR) 50 MCG/HR Place 1 patch (50 mcg total) onto the skin every 3 (three) days. 01/28/16   Levert Feinstein, MD  gabapentin (NEURONTIN) 100 MG capsule Take 100 mg by mouth.    Historical Provider, MD  HYDROcodone-acetaminophen (NORCO/VICODIN) 5-325 MG tablet Take one tablet every 8 hours as needed for pain. 01/07/16   Asa Lente, MD  Omega-3 Fatty Acids (FISH OIL) 1000 MG CAPS Take by mouth.    Historical Provider, MD  ondansetron (ZOFRAN) 4 MG tablet Take 4 mg by mouth every 8 (eight) hours as needed for nausea or vomiting.    Historical Provider, MD  ondansetron (ZOFRAN) 8 MG tablet Take 8 mg by mouth every 8 (eight) hours as needed for nausea or vomiting.    Historical Provider, MD  terbinafine (LAMISIL) 250 MG tablet Take 250 mg by mouth daily. Reported on 12/11/2015    Historical Provider, MD     Family History  Problem Relation Age of Onset  . Heart disease Daughter   . Dementia Mother   . Heart attack Father     Social History   Social History  . Marital Status: Widowed    Spouse Name: N/A  . Number of Children: 3  . Years  of Education: 12   Occupational History  . Retired    Social History Main Topics  . Smoking status: Current Every Day Smoker -- 1.50 packs/day    Types: Cigarettes  . Smokeless tobacco: Not on file  . Alcohol Use: 0.0 oz/week    0 Standard drinks or equivalent per week     Comment: Occasionally  . Drug Use: No  . Sexual Activity: Not on file   Other Topics Concern  . Not on file   Social History Narrative   Lives at home alone (daughter often stays with her).   Right-handed.   No caffeine use.    Review of Systems: A 12 point ROS discussed and pertinent positives are indicated in the HPI above.  All other systems are  negative.  Review of Systems  Constitutional: Positive for activity change, appetite change, fatigue and unexpected weight change. Negative for fever.  HENT: Positive for trouble swallowing. Negative for drooling.   Respiratory: Negative for shortness of breath.   Neurological: Positive for weakness.  Psychiatric/Behavioral: Positive for behavioral problems and confusion.    Vital Signs: There were no vitals taken for this visit.  Physical Exam  Constitutional:  frail thin Ill appearing   Cardiovascular: Normal rate and regular rhythm.   Pulmonary/Chest: Effort normal and breath sounds normal.  Abdominal: Soft. Bowel sounds are normal.  Musculoskeletal:  Does not follow commands Non verbal  Skin: Skin is warm and dry.  Psychiatric:  Consented with dtr at bedside  Nursing note and vitals reviewed.   Mallampati Score:  MD Evaluation Airway: WNL Heart: WNL Abdomen: WNL Chest/ Lungs: WNL ASA  Classification: 3 Mallampati/Airway Score: Three  Imaging: Ct Abdomen Pelvis W Contrast  02/17/2016  CLINICAL DATA:  Follow-up, unable to obtain history from patient. CT abdomen of 01/31/2016 described a markedly abnormal loop of small bowel within the right lower quadrant with extensive wall thickening, associated mesenteric edema and right lower quadrant free fluid. Short-term follow-up CT was also recommended for an indeterminate soft tissue within the right lower quadrant small bowel mesentery. EXAM: CT ABDOMEN AND PELVIS WITH CONTRAST TECHNIQUE: Multidetector CT imaging of the abdomen and pelvis was performed using the standard protocol following bolus administration of intravenous contrast. CONTRAST:  ISOVUE-300 IOPAMIDOL (ISOVUE-300) INJECTION 61% COMPARISON:  CT abdomen dated 01/31/2016. FINDINGS: Lower chest: Combination of atelectasis and probable fibrosis at each lung base. Hepatobiliary: Status post cholecystectomy.  Liver appears normal. Pancreas: Pancreas is unremarkable.  Spleen: Within normal limits in size and appearance. Adrenals/Urinary Tract: Bladder is moderately distended. Small amount of air within the bladder is likely related to recent catheterization. There is mild bilateral hydronephrosis due to the bladder distension. Kidneys otherwise unremarkable. No renal stone or perinephric fluid. Adrenal glands are unremarkable. Stomach/Bowel: Enteric tube appears adequately positioned in the stomach with tip at the level of the duodenal bulb. Interval resolution of the small bowel dilatation. Large bowel is normal in caliber. There are surgical changes of interval partial bowel resection with anastomosis in the right lower quadrant. No obstruction, fluid collection or other surgical complicating feature. Vascular/Lymphatic: Atherosclerotic changes throughout the abdominal aorta and pelvic vasculature. No change in size of the infrarenal abdominal aortic aneurysm in the short-term interval, again measuring 4.1 x 4 cm. No periaortic hemorrhage or edema. Reproductive: No mass or other significant abnormality. Other: Stable appearance of the soft tissue density material in the right lower quadrant mesentery, measuring approximately 1.3 x 1 cm. Additional nodular density within a slightly more inferior  and lateral portion of the right lower quadrant mesentery is new compared to the previous study and measures 1.6 x 1.2 cm. No abscess collection or significant free fluid. No free intraperitoneal air. Musculoskeletal: Mild degenerative change throughout the slightly scoliotic thoracolumbar spine. No acute or suspicious osseous lesion. Surgical staples are seen along the anterior abdominal wall in the midline. Superficial soft tissues otherwise unremarkable. IMPRESSION: 1. Bladder is prominently distended causing mild bilateral hydronephrosis. Bladder outlet obstruction? 2. Status post interval partial bowel resection with anastomosis in the right lower quadrant. There is now no bowel  dilatation. No evidence of surgical complicating feature. 3. Stable appearance of the nodular soft tissue density material in the right lower quadrant mesentery, measuring 1.3 x 1 cm. This remains indeterminate but I favor mildly prominent reactive lymph node. Other differential considerations would include mesenteric carcinoid or neoplastic/metastatic implant. Recommend follow-up CT in 3 months to ensure stability or resolution. 4. New similar-appearing nodular soft tissue density material within a slightly more inferior and lateral portion of the right lower quadrant mesentery, measuring 1.6 x 1.2 cm. This is likely expected postsurgical change and/or new reactive lymph node. Recommend attention to this area on the follow-up CT in 3 months as recommended above. 5. Abdominal aortic aneurysm is stable in the short-term interval. Please see recommendations from the previous CT. 6. Aortic atherosclerosis. Electronically Signed   By: Bary Richard M.D.   On: 02/17/2016 18:58   Dg Chest Port 1 View  03/01/2016  CLINICAL DATA:  Pneumonia. EXAM: PORTABLE CHEST 1 VIEW COMPARISON:  Chest x-ray dated 02/28/2016. FINDINGS: Left-sided PICC line in place with tip at the level of the upper SVC. Nasogastric tube passes below the diaphragm. Heart size is upper normal, stable. Overall cardiomediastinal silhouette is stable in size and configuration. Atherosclerotic changes again noted at the aortic arch. There is mild interstitial prominence, most likely mild interstitial edema. Lungs otherwise clear. No evidence of pneumonia. No pleural effusion or pneumothorax seen. IMPRESSION: 1. Further interval retraction of the left-sided PICC line. PICC line tip now positioned at the level of the upper SVC. 2. Stable mild interstitial edema. Lungs otherwise clear. No evidence of pneumonia seen. 3. Aortic atherosclerosis. Electronically Signed   By: Bary Richard M.D.   On: 03/01/2016 12:56   Dg Chest Port 1 View  02/28/2016  CLINICAL  DATA:  PICC line placement, retracted 3 cm. EXAM: PORTABLE CHEST 1 VIEW COMPARISON:  02/28/2016 FINDINGS: PICC line has been retracted, tip now overlying the level of the mid superior vena cava. Nasogastric tube is in place, tip overlying the level of the distal stomach or proximal duodenum. Heart size is normal. Lungs are clear. IMPRESSION: PICC line tip overlying the level of the superior vena cava. Electronically Signed   By: Norva Pavlov M.D.   On: 02/28/2016 21:34   Dg Chest Port 1 View  02/28/2016  CLINICAL DATA:  Evaluate PICC line placement EXAM: PORTABLE CHEST 1 VIEW COMPARISON:  02/28/2016 FINDINGS: There is a left arm PICC line with tip in the right atrium. There is a enteric tube with tip in the distal stomach. Normal heart size. No pleural effusion or edema. No airspace consolidation. IMPRESSION: 1. The left arm PICC line tip is in the right atrium. Electronically Signed   By: Signa Kell M.D.   On: 02/28/2016 13:41   Dg Chest Port 1 View  02/28/2016  CLINICAL DATA:  Respiratory failure EXAM: PORTABLE CHEST 1 VIEW COMPARISON:  02/12/2016 FINDINGS: Since the previous exam  there has been removal of the nasogastric tube and right subclavian catheter. The heart size is normal. There is no pleural effusion or edema. Aortic atherosclerosis is identified. Interval clearing of previous interstitial edema and airspace opacities. IMPRESSION: 1. No acute cardiopulmonary abnormalities identified at this time. Electronically Signed   By: Signa Kellaylor  Stroud M.D.   On: 02/28/2016 09:38   Dg Chest Port 1 View  02/12/2016  CLINICAL DATA:  Respiratory failure EXAM: PORTABLE CHEST 1 VIEW COMPARISON:  02/09/2016 FINDINGS: Central venous catheter tip at the cavoatrial junction unchanged. NG tube in the stomach Decreased lung volume since the prior study. Progression of right lower lobe atelectasis Diffuse bilateral airspace disease shows mild interval improvement. Probable edema. No effusion. IMPRESSION: Mild  improvement in diffuse bilateral airspace disease most likely edema. Hypoventilation with decreased lung volume compared with the prior study. Electronically Signed   By: Marlan Palauharles  Clark M.D.   On: 02/12/2016 07:30   Dg Abd Portable 1v  02/28/2016  CLINICAL DATA:  NG tube placement. EXAM: PORTABLE ABDOMEN - 1 VIEW COMPARISON:  02/21/2016 FINDINGS: NG tube tip is in the intra pyloric region of the stomach, possibly just through the pylorus. Bowel gas pattern is nonspecific. Suturing contrast material is identified in the right colon. IMPRESSION: NG tube tip positioned near the gastric pylorus. Electronically Signed   By: Kennith CenterEric  Mansell M.D.   On: 02/28/2016 11:13   Dg Abd Portable 1v  02/21/2016  CLINICAL DATA:  75 year old female with history of nasogastric tube placement. EXAM: PORTABLE ABDOMEN - 1 VIEW COMPARISON:  Old abdominal radiograph 02/11/2016. FINDINGS: Nasogastric tubes tip is in the antral pre-pyloric region of the stomach. Visualized bowel gas pattern is nonobstructive. Rectal bag in place. Surgical clips project over the right upper quadrant of the abdomen, compatible with prior cholecystectomy. Atherosclerosis throughout the abdominal and pelvic vasculature. IMPRESSION: 1. Tip of nasogastric tube is in the antral pre-pyloric region of the stomach. 2. Aortic atherosclerosis. Electronically Signed   By: Trudie Reedaniel  Entrikin M.D.   On: 02/21/2016 19:39   Dg Abd Portable 1v  02/11/2016  CLINICAL DATA:  NG tube placement. EXAM: PORTABLE ABDOMEN - 1 VIEW COMPARISON:  None. FINDINGS: 1720 hours. NG tube tip is in the antral pyloric region of the stomach. Bowel gas pattern is nonspecific. Surgical clips overlie the midline. IMPRESSION: NG tube tip is in the distal stomach, near the pylorus. Electronically Signed   By: Kennith CenterEric  Mansell M.D.   On: 02/11/2016 17:41    Labs:  CBC:  Recent Labs  02/27/16 0752 02/28/16 1615 03/01/16 0500 03/02/16 0600  WBC 14.8* 16.6* 12.8* 7.4  HGB 13.9 11.6* 9.1*  7.5*  HCT 44.2 38.4 30.1* 23.4*  PLT 176 136* 83* 56*    COAGS:  Recent Labs  02/12/16 0620 03/02/16 0600  INR 1.43 1.39    BMP:  Recent Labs  02/28/16 1615 02/29/16 1434 03/01/16 0500 03/02/16 0600  NA 155* 154* 141 130*  K 4.6 3.0* 3.0* 2.7*  CL 119* 120* 111 102  CO2 24 27 24  21*  GLUCOSE 271* 100* 205* 340*  BUN 101* 74* 54* 18  CALCIUM 10.0 8.7* 7.4* 6.7*  CREATININE 2.18* 1.08* 0.84 0.73  GFRNONAA 21* 49* >60 >60  GFRAA 24* 57* >60 >60    LIVER FUNCTION TESTS:  Recent Labs  02/12/16 0620 02/27/16 0752  BILITOT 0.7 0.7  AST 31 38  ALT 29 54  ALKPHOS 49 84  PROT 5.8* 7.1  ALBUMIN 1.9* 2.7*    TUMOR  MARKERS: No results for input(s): AFPTM, CEA, CA199, CHROMGRNA in the last 8760 hours.  Assessment and Plan:  Dysphagia  Aspiration risk Non verbal; need for long term care Malnutrition Scheduled for percutaneous gastric tube placement Thrombocytopenia; check am labs- may need transfusion Risks and Benefits discussed with the patient and daughter including, but not limited to the need for a barium enema during the procedure, bleeding, infection, peritonitis, or damage to adjacent structures. All of the patient's and daughter questions were answered, agreeable to proceed. Consent signed and in chart.   Thank you for this interesting consult.  I greatly enjoyed meeting Tiffany Horne and look forward to participating in their care.  A copy of this report was sent to the requesting provider on this date.  Electronically Signed: Ralene Muskrat A 03/02/2016, 11:23 AM   I spent a total of 40 Minutes    in face to face in clinical consultation, greater than 50% of which was counseling/coordinating care for percutaneous gastric tube placement

## 2016-03-03 ENCOUNTER — Encounter (HOSPITAL_BASED_OUTPATIENT_CLINIC_OR_DEPARTMENT_OTHER): Payer: Self-pay

## 2016-03-03 ENCOUNTER — Encounter (HOSPITAL_COMMUNITY)

## 2016-03-03 DIAGNOSIS — R52 Pain, unspecified: Secondary | ICD-10-CM

## 2016-03-03 LAB — CBC
HCT: 27 % — ABNORMAL LOW (ref 36.0–46.0)
Hemoglobin: 8.4 g/dL — ABNORMAL LOW (ref 12.0–15.0)
MCH: 30.9 pg (ref 26.0–34.0)
MCHC: 31.1 g/dL (ref 30.0–36.0)
MCV: 99.3 fL (ref 78.0–100.0)
PLATELETS: 61 10*3/uL — AB (ref 150–400)
RBC: 2.72 MIL/uL — ABNORMAL LOW (ref 3.87–5.11)
RDW: 14.8 % (ref 11.5–15.5)
WBC: 5.4 10*3/uL (ref 4.0–10.5)

## 2016-03-03 LAB — APTT: APTT: 48 s — AB (ref 24–37)

## 2016-03-03 LAB — PROTIME-INR
INR: 1.27 (ref 0.00–1.49)
PROTHROMBIN TIME: 16 s — AB (ref 11.6–15.2)

## 2016-03-03 NOTE — Progress Notes (Signed)
*  PRELIMINARY RESULTS* Vascular Ultrasound Lower extremity venous duplex has been completed.  Preliminary findings: no evidence of DVT in visualized veins.   Farrel DemarkJill Eunice, RDMS, RVT  03/03/2016, 11:26 AM

## 2016-03-03 NOTE — Progress Notes (Addendum)
Patient ID: Margie EgeBarbara A Hnat, female   DOB: 08/09/1941, 75 y.o.   MRN: [id number]   Planned for G tube 7/19 Consult note in chart  Plt still at 61 today  Plt 7/15: 136 ---81---56---61 today  discussed with Dr Grace IsaacWatts Will wait for MD to work up new thrombocytopenia  Will cancel order for G tube at this point MD can reorder when appropriate and work up for thrombocytopenia complete

## 2016-03-04 LAB — BASIC METABOLIC PANEL
ANION GAP: 3 — AB (ref 5–15)
BUN: 17 mg/dL (ref 6–20)
CHLORIDE: 110 mmol/L (ref 101–111)
CO2: 25 mmol/L (ref 22–32)
CREATININE: 0.47 mg/dL (ref 0.44–1.00)
Calcium: 7.9 mg/dL — ABNORMAL LOW (ref 8.9–10.3)
GFR calc non Af Amer: >60 mL/min (ref >60)
Glucose, Bld: 135 mg/dL — ABNORMAL HIGH (ref 65–99)
POTASSIUM: 3.4 mmol/L — AB (ref 3.5–5.1)
SODIUM: 138 mmol/L (ref 135–145)

## 2016-03-04 LAB — HEPATIC FUNCTION PANEL
ALBUMIN: 1.6 g/dL — AB (ref 3.5–5.0)
ALT: 25 U/L (ref 14–54)
AST: 33 U/L (ref 15–41)
Alkaline Phosphatase: 75 U/L (ref 38–126)
BILIRUBIN DIRECT: 0.1 mg/dL (ref 0.1–0.5)
Indirect Bilirubin: 0.2 mg/dL — ABNORMAL LOW (ref 0.3–0.9)
TOTAL PROTEIN: 5.4 g/dL — AB (ref 6.5–8.1)
Total Bilirubin: 0.3 mg/dL (ref 0.3–1.2)

## 2016-03-04 LAB — MAGNESIUM: Magnesium: 1.5 mg/dL — ABNORMAL LOW (ref 1.7–2.4)

## 2016-03-04 LAB — CBC
HCT: 27.4 % — ABNORMAL LOW (ref 36.0–46.0)
Hemoglobin: 8.3 g/dL — ABNORMAL LOW (ref 12.0–15.0)
MCH: 30.3 pg (ref 26.0–34.0)
MCHC: 30.3 g/dL (ref 30.0–36.0)
MCV: 100 fL (ref 78.0–100.0)
PLATELETS: 64 10*3/uL — AB (ref 150–400)
RBC: 2.74 MIL/uL — AB (ref 3.87–5.11)
RDW: 14.8 % (ref 11.5–15.5)
WBC: 4.8 10*3/uL (ref 4.0–10.5)

## 2016-03-04 LAB — SAVE SMEAR

## 2016-03-04 NOTE — Progress Notes (Signed)
Patient ID: Tiffany EgeBarbara A Horne, female   DOB: 05/22/1941, 75 y.o.   MRN: [id number]   Plt 64 today New thrombocytopenia to be evaluated and worked up by MD.  Please re order G tube when appropriate

## 2016-03-05 LAB — BASIC METABOLIC PANEL
ANION GAP: 6 (ref 5–15)
BUN: 15 mg/dL (ref 6–20)
CHLORIDE: 103 mmol/L (ref 101–111)
CO2: 24 mmol/L (ref 22–32)
CREATININE: 0.39 mg/dL — AB (ref 0.44–1.00)
Calcium: 7.4 mg/dL — ABNORMAL LOW (ref 8.9–10.3)
GFR calc Af Amer: >60 mL/min (ref >60)
GFR calc non Af Amer: >60 mL/min (ref >60)
Glucose, Bld: 131 mg/dL — ABNORMAL HIGH (ref 65–99)
POTASSIUM: 3.1 mmol/L — AB (ref 3.5–5.1)
Sodium: 133 mmol/L — ABNORMAL LOW (ref 135–145)

## 2016-03-05 LAB — CBC
HEMATOCRIT: 26.4 % — AB (ref 36.0–46.0)
Hemoglobin: 8.2 g/dL — ABNORMAL LOW (ref 12.0–15.0)
MCH: 30.3 pg (ref 26.0–34.0)
MCHC: 31.1 g/dL (ref 30.0–36.0)
MCV: 97.4 fL (ref 78.0–100.0)
Platelets: 62 10*3/uL — ABNORMAL LOW (ref 150–400)
RBC: 2.71 MIL/uL — AB (ref 3.87–5.11)
RDW: 14.8 % (ref 11.5–15.5)
WBC: 6.8 10*3/uL (ref 4.0–10.5)

## 2016-03-05 LAB — MAGNESIUM: MAGNESIUM: 1.2 mg/dL — AB (ref 1.7–2.4)

## 2016-03-06 LAB — CBC
HEMATOCRIT: 27.7 % — AB (ref 36.0–46.0)
Hemoglobin: 8.4 g/dL — ABNORMAL LOW (ref 12.0–15.0)
MCH: 29.4 pg (ref 26.0–34.0)
MCHC: 30.3 g/dL (ref 30.0–36.0)
MCV: 96.9 fL (ref 78.0–100.0)
Platelets: 70 10*3/uL — ABNORMAL LOW (ref 150–400)
RBC: 2.86 MIL/uL — ABNORMAL LOW (ref 3.87–5.11)
RDW: 14.9 % (ref 11.5–15.5)
WBC: 8.3 10*3/uL (ref 4.0–10.5)

## 2016-03-06 LAB — BASIC METABOLIC PANEL
Anion gap: 8 (ref 5–15)
BUN: 17 mg/dL (ref 6–20)
CALCIUM: 7.7 mg/dL — AB (ref 8.9–10.3)
CO2: 26 mmol/L (ref 22–32)
CREATININE: 0.51 mg/dL (ref 0.44–1.00)
Chloride: 103 mmol/L (ref 101–111)
GFR calc Af Amer: >60 mL/min (ref >60)
GFR calc non Af Amer: >60 mL/min (ref >60)
GLUCOSE: 129 mg/dL — AB (ref 65–99)
Potassium: 4 mmol/L (ref 3.5–5.1)
Sodium: 137 mmol/L (ref 135–145)

## 2016-03-06 LAB — MAGNESIUM: Magnesium: 1.3 mg/dL — ABNORMAL LOW (ref 1.7–2.4)

## 2016-03-07 LAB — CBC WITH DIFFERENTIAL/PLATELET
BASOS ABS: 0 10*3/uL (ref 0.0–0.1)
Basophils Relative: 0 %
EOS PCT: 2 %
Eosinophils Absolute: 0.1 10*3/uL (ref 0.0–0.7)
HEMATOCRIT: 26.1 % — AB (ref 36.0–46.0)
HEMOGLOBIN: 8.2 g/dL — AB (ref 12.0–15.0)
LYMPHS ABS: 0.8 10*3/uL (ref 0.7–4.0)
LYMPHS PCT: 11 %
MCH: 30.3 pg (ref 26.0–34.0)
MCHC: 31.4 g/dL (ref 30.0–36.0)
MCV: 96.3 fL (ref 78.0–100.0)
MONOS PCT: 4 %
Monocytes Absolute: 0.3 10*3/uL (ref 0.1–1.0)
NEUTROS PCT: 83 %
Neutro Abs: 5.7 10*3/uL (ref 1.7–7.7)
Platelets: 75 10*3/uL — ABNORMAL LOW (ref 150–400)
RBC: 2.71 MIL/uL — AB (ref 3.87–5.11)
RDW: 15.1 % (ref 11.5–15.5)
WBC: 6.9 10*3/uL (ref 4.0–10.5)

## 2016-03-07 LAB — BASIC METABOLIC PANEL
ANION GAP: 8 (ref 5–15)
BUN: 23 mg/dL — ABNORMAL HIGH (ref 6–20)
CALCIUM: 7.8 mg/dL — AB (ref 8.9–10.3)
CHLORIDE: 102 mmol/L (ref 101–111)
CO2: 26 mmol/L (ref 22–32)
Creatinine, Ser: 0.46 mg/dL (ref 0.44–1.00)
GFR calc non Af Amer: >60 mL/min (ref >60)
GLUCOSE: 123 mg/dL — AB (ref 65–99)
POTASSIUM: 4.1 mmol/L (ref 3.5–5.1)
Sodium: 136 mmol/L (ref 135–145)

## 2016-03-07 LAB — MAGNESIUM: Magnesium: 1.3 mg/dL — ABNORMAL LOW (ref 1.7–2.4)

## 2016-03-08 LAB — CBC
HCT: 24.3 % — ABNORMAL LOW (ref 36.0–46.0)
Hemoglobin: 7.7 g/dL — ABNORMAL LOW (ref 12.0–15.0)
MCH: 30.4 pg (ref 26.0–34.0)
MCHC: 31.7 g/dL (ref 30.0–36.0)
MCV: 96 fL (ref 78.0–100.0)
PLATELETS: 96 10*3/uL — AB (ref 150–400)
RBC: 2.53 MIL/uL — ABNORMAL LOW (ref 3.87–5.11)
RDW: 15.2 % (ref 11.5–15.5)
WBC: 8.1 10*3/uL (ref 4.0–10.5)

## 2016-03-08 LAB — BASIC METABOLIC PANEL
ANION GAP: 7 (ref 5–15)
BUN: 28 mg/dL — ABNORMAL HIGH (ref 6–20)
CALCIUM: 7.8 mg/dL — AB (ref 8.9–10.3)
CO2: 28 mmol/L (ref 22–32)
Chloride: 103 mmol/L (ref 101–111)
Creatinine, Ser: 0.37 mg/dL — ABNORMAL LOW (ref 0.44–1.00)
GLUCOSE: 122 mg/dL — AB (ref 65–99)
Potassium: 3.8 mmol/L (ref 3.5–5.1)
Sodium: 138 mmol/L (ref 135–145)

## 2016-03-08 LAB — PROTIME-INR
INR: 1.19 (ref 0.00–1.49)
Prothrombin Time: 15.3 seconds — ABNORMAL HIGH (ref 11.6–15.2)

## 2016-03-08 NOTE — Progress Notes (Signed)
Patient ID: Tiffany Horne, female   DOB: 27-Aug-1940, 75 y.o.   MRN: [id number]    Referring Physician(s): Dr. Carron Curie  Supervising Physician: Richarda Overlie  Patient Status: inpt  Chief Complaint: dysphagia  Subjective: Patient known to [place] from last week for g-tube request.  She developed new onset thrombocytopenia and her procedure was cancelled so this could be worked up.  This was found to be secondary to her Pepcid.  Once her Pepcid was held, her platelets have begun to increase.  They are 96K today up from 56K at the lowest.  We have been asked to see the patient again for g-tube placement.  Allergies: Review of patient's allergies indicates no known allergies.  Medications: Prior to Admission medications   Medication Sig Start Date End Date Taking? Authorizing Provider  acetaminophen (TYLENOL) 650 MG CR tablet Take 650 mg by mouth every 8 (eight) hours as needed for pain.    Historical Provider, MD  ALPRAZolam Prudy Feeler) 0.5 MG tablet Take 0.5 mg by mouth at bedtime as needed for anxiety.    Historical Provider, MD  ATORVASTATIN CALCIUM PO Take by mouth.    Historical Provider, MD  CALCIUM PO Take by mouth.    Historical Provider, MD  celecoxib (CELEBREX) 100 MG capsule Take 1 capsule (100 mg total) by mouth 2 (two) times daily. 01/28/16   Levert Feinstein, MD  Cholecalciferol (VITAMIN D-3 PO) Take by mouth.    Historical Provider, MD  diazepam (VALIUM) 10 MG tablet Take 10 mg by mouth every 6 (six) hours as needed for anxiety.    Historical Provider, MD  DULoxetine (CYMBALTA) 30 MG capsule Take 2 capsules (60 mg total) by mouth daily. 01/20/16   Levert Feinstein, MD  fentaNYL (DURAGESIC - DOSED MCG/HR) 50 MCG/HR Place 1 patch (50 mcg total) onto the skin every 3 (three) days. 01/28/16   Levert Feinstein, MD  gabapentin (NEURONTIN) 100 MG capsule Take 100 mg by mouth.    Historical Provider, MD  HYDROcodone-acetaminophen (NORCO/VICODIN) 5-325 MG tablet Take one tablet every 8 hours as needed for pain.  01/07/16   Asa Lente, MD  Omega-3 Fatty Acids (FISH OIL) 1000 MG CAPS Take by mouth.    Historical Provider, MD  ondansetron (ZOFRAN) 4 MG tablet Take 4 mg by mouth every 8 (eight) hours as needed for nausea or vomiting.    Historical Provider, MD  ondansetron (ZOFRAN) 8 MG tablet Take 8 mg by mouth every 8 (eight) hours as needed for nausea or vomiting.    Historical Provider, MD  terbinafine (LAMISIL) 250 MG tablet Take 250 mg by mouth daily. Reported on 12/11/2015    Historical Provider, MD    Vital Signs: There were no vitals taken for this visit.  Physical Exam: Gen: laying in bed in NAD, non-verbal (tries to mouth words and speak, but minimal volume) Heart: regular Lungs: slightly rhonchi noted on right side, otherwise CTA Abd: soft, NT, fresh scar on her abdomen from recent laparotomy. ND  Imaging: No results found.  Labs:  CBC:  Recent Labs  03/05/16 0500 03/06/16 0700 03/07/16 0655 03/08/16 0645  WBC 6.8 8.3 6.9 8.1  HGB 8.2* 8.4* 8.2* 7.7*  HCT 26.4* 27.7* 26.1* 24.3*  PLT 62* 70* 75* 96*    COAGS:  Recent Labs  02/12/16 0620 03/02/16 0600 03/03/16 0650  INR 1.43 1.39 1.27  APTT  --   --  48*    BMP:  Recent Labs  03/05/16 0500 03/06/16 0700 03/07/16 4098  03/08/16 0645  NA 133* 137 136 138  K 3.1* 4.0 4.1 3.8  CL 103 103 102 103  CO2 24 26 26 28   GLUCOSE 131* 129* 123* 122*  BUN 15 17 23* 28*  CALCIUM 7.4* 7.7* 7.8* 7.8*  CREATININE 0.39* 0.51 0.46 0.37*  GFRNONAA >60 >60 >60 >60  GFRAA >60 >60 >60 >60    LIVER FUNCTION TESTS:  Recent Labs  02/12/16 0620 02/27/16 0752 03/04/16 0525  BILITOT 0.7 0.7 0.3  AST 31 38 33  ALT 29 54 25  ALKPHOS 49 84 75  PROT 5.8* 7.1 5.4*  ALBUMIN 1.9* 2.7* 1.6*    Assessment and Plan: 1. Dysphagia  -her platelet count continues to increase and improve after Pepcid has been stopped. -consent for the procedure has already been obtained from the daughter last week -recheck her PT/INR today to  make sure it is still normal. -check cbc in am just to make sure platelets remain stable to improving prior to her procedure. -tentatively plan for g-tube tomorrow if able.  Electronically Signed: Letha Cape 03/08/2016, 12:51 PM   I spent a total of 25 Minutes at the the patient's bedside AND on the patient's hospital floor or unit, greater than 50% of which was counseling/coordinating care for dysphagia

## 2016-03-09 LAB — CBC
HEMATOCRIT: 21.8 % — AB (ref 36.0–46.0)
HEMOGLOBIN: 6.8 g/dL — AB (ref 12.0–15.0)
MCH: 30.2 pg (ref 26.0–34.0)
MCHC: 31.2 g/dL (ref 30.0–36.0)
MCV: 96.9 fL (ref 78.0–100.0)
Platelets: 103 10*3/uL — ABNORMAL LOW (ref 150–400)
RBC: 2.25 MIL/uL — AB (ref 3.87–5.11)
RDW: 15.3 % (ref 11.5–15.5)
WBC: 7.6 10*3/uL (ref 4.0–10.5)

## 2016-03-09 LAB — BLOOD GAS, ARTERIAL
ACID-BASE EXCESS: 3.7 mmol/L — AB (ref 0.0–2.0)
Bicarbonate: 26.8 mEq/L — ABNORMAL HIGH (ref 20.0–24.0)
O2 Content: 5 L/min
O2 Saturation: 98.1 %
PCO2 ART: 34.4 mmHg — AB (ref 35.0–45.0)
PH ART: 7.503 — AB (ref 7.350–7.450)
Patient temperature: 98.6
TCO2: 27.9 mmol/L (ref 0–100)
pO2, Arterial: 97.5 mmHg (ref 80.0–100.0)

## 2016-03-09 LAB — ABO/RH: ABO/RH(D): O NEG

## 2016-03-09 LAB — PREPARE RBC (CROSSMATCH)

## 2016-03-10 ENCOUNTER — Other Ambulatory Visit (HOSPITAL_COMMUNITY): Payer: Self-pay

## 2016-03-10 LAB — BLOOD GAS, ARTERIAL
ACID-BASE EXCESS: 2.3 mmol/L — AB (ref 0.0–2.0)
BICARBONATE: 26.6 meq/L — AB (ref 20.0–24.0)
O2 CONTENT: 5 L/min
O2 Saturation: 98.3 %
PATIENT TEMPERATURE: 98.6
PCO2 ART: 43.7 mmHg (ref 35.0–45.0)
PO2 ART: 114 mmHg — AB (ref 80.0–100.0)
TCO2: 28 mmol/L (ref 0–100)
pH, Arterial: 7.402 (ref 7.350–7.450)

## 2016-03-10 LAB — CBC WITH DIFFERENTIAL/PLATELET
BASOS ABS: 0.1 10*3/uL (ref 0.0–0.1)
BASOS PCT: 1 %
EOS PCT: 0 %
Eosinophils Absolute: 0 10*3/uL (ref 0.0–0.7)
HCT: 27.3 % — ABNORMAL LOW (ref 36.0–46.0)
Hemoglobin: 8.6 g/dL — ABNORMAL LOW (ref 12.0–15.0)
LYMPHS PCT: 12 %
Lymphs Abs: 0.7 10*3/uL (ref 0.7–4.0)
MCH: 30.2 pg (ref 26.0–34.0)
MCHC: 31.5 g/dL (ref 30.0–36.0)
MCV: 95.8 fL (ref 78.0–100.0)
MONO ABS: 0.2 10*3/uL (ref 0.1–1.0)
Monocytes Relative: 4 %
NEUTROS PCT: 83 %
Neutro Abs: 4.8 10*3/uL (ref 1.7–7.7)
PLATELETS: 91 10*3/uL — AB (ref 150–400)
RBC: 2.85 MIL/uL — ABNORMAL LOW (ref 3.87–5.11)
RDW: 15.8 % — ABNORMAL HIGH (ref 11.5–15.5)
WBC: 5.8 10*3/uL (ref 4.0–10.5)
nRBC: 4 /100 WBC — ABNORMAL HIGH

## 2016-03-10 LAB — BASIC METABOLIC PANEL
Anion gap: 9 (ref 5–15)
BUN: 21 mg/dL — ABNORMAL HIGH (ref 6–20)
CALCIUM: 7.9 mg/dL — AB (ref 8.9–10.3)
CO2: 27 mmol/L (ref 22–32)
CREATININE: 0.39 mg/dL — AB (ref 0.44–1.00)
Chloride: 113 mmol/L — ABNORMAL HIGH (ref 101–111)
Glucose, Bld: 89 mg/dL (ref 65–99)
Potassium: 3.8 mmol/L (ref 3.5–5.1)
SODIUM: 149 mmol/L — AB (ref 135–145)

## 2016-03-10 LAB — COMPREHENSIVE METABOLIC PANEL
ALK PHOS: 82 U/L (ref 38–126)
ALT: 79 U/L — AB (ref 14–54)
AST: 67 U/L — AB (ref 15–41)
Albumin: 1.5 g/dL — ABNORMAL LOW (ref 3.5–5.0)
Anion gap: 8 (ref 5–15)
BILIRUBIN TOTAL: 0.8 mg/dL (ref 0.3–1.2)
BUN: 24 mg/dL — AB (ref 6–20)
CALCIUM: 7.8 mg/dL — AB (ref 8.9–10.3)
CHLORIDE: 113 mmol/L — AB (ref 101–111)
CO2: 23 mmol/L (ref 22–32)
CREATININE: 0.48 mg/dL (ref 0.44–1.00)
Glucose, Bld: 149 mg/dL — ABNORMAL HIGH (ref 65–99)
Potassium: 4 mmol/L (ref 3.5–5.1)
Sodium: 144 mmol/L (ref 135–145)
Total Protein: 5.2 g/dL — ABNORMAL LOW (ref 6.5–8.1)

## 2016-03-10 LAB — LACTIC ACID, PLASMA
LACTIC ACID, VENOUS: 2.2 mmol/L — AB (ref 0.5–1.9)
LACTIC ACID, VENOUS: 1.5 mmol/L (ref 0.5–1.9)

## 2016-03-10 LAB — TYPE AND SCREEN
ABO/RH(D): O NEG
Antibody Screen: NEGATIVE
UNIT DIVISION: 0

## 2016-03-10 LAB — C DIFFICILE QUICK SCREEN W PCR REFLEX
C DIFFICILE (CDIFF) INTERP: NOT DETECTED
C DIFFICILE (CDIFF) TOXIN: NEGATIVE
C DIFFICLE (CDIFF) ANTIGEN: NEGATIVE

## 2016-03-10 LAB — CBC
HCT: 23.7 % — ABNORMAL LOW (ref 36.0–46.0)
Hemoglobin: 7.5 g/dL — ABNORMAL LOW (ref 12.0–15.0)
MCH: 30.1 pg (ref 26.0–34.0)
MCHC: 31.6 g/dL (ref 30.0–36.0)
MCV: 95.2 fL (ref 78.0–100.0)
PLATELETS: 92 10*3/uL — AB (ref 150–400)
RBC: 2.49 MIL/uL — ABNORMAL LOW (ref 3.87–5.11)
RDW: 15.7 % — AB (ref 11.5–15.5)
WBC: 5 10*3/uL (ref 4.0–10.5)

## 2016-03-10 LAB — MAGNESIUM: MAGNESIUM: 1.5 mg/dL — AB (ref 1.7–2.4)

## 2016-03-10 LAB — PHOSPHORUS: Phosphorus: 2.6 mg/dL (ref 2.5–4.6)

## 2016-03-11 ENCOUNTER — Other Ambulatory Visit (HOSPITAL_COMMUNITY): Payer: Self-pay

## 2016-03-11 DIAGNOSIS — J690 Pneumonitis due to inhalation of food and vomit: Secondary | ICD-10-CM

## 2016-03-11 DIAGNOSIS — J9600 Acute respiratory failure, unspecified whether with hypoxia or hypercapnia: Secondary | ICD-10-CM

## 2016-03-11 DIAGNOSIS — G9340 Encephalopathy, unspecified: Secondary | ICD-10-CM | POA: Diagnosis not present

## 2016-03-11 DIAGNOSIS — J9601 Acute respiratory failure with hypoxia: Secondary | ICD-10-CM

## 2016-03-11 LAB — BLOOD GAS, ARTERIAL
ACID-BASE DEFICIT: 4.5 mmol/L — AB (ref 0.0–2.0)
Acid-base deficit: 4.3 mmol/L — ABNORMAL HIGH (ref 0.0–2.0)
BICARBONATE: 21.5 meq/L (ref 20.0–24.0)
Bicarbonate: 20.9 mEq/L (ref 20.0–24.0)
FIO2: 1
LHR: 20 {breaths}/min
MECHVT: 430 mL
O2 CONTENT: 3 L/min
O2 Saturation: 94 %
O2 Saturation: 94.5 %
PATIENT TEMPERATURE: 98.6
PCO2 ART: 48.4 mmHg — AB (ref 35.0–45.0)
PEEP/CPAP: 5 cmH2O
PH ART: 7.27 — AB (ref 7.350–7.450)
PH ART: 7.296 — AB (ref 7.350–7.450)
Patient temperature: 98.6
TCO2: 23 mmol/L (ref 0–100)
TCO2: 22.3 mmol/L (ref 0–100)
pCO2 arterial: 44.3 mmHg (ref 35.0–45.0)
pO2, Arterial: 78.7 mmHg — ABNORMAL LOW (ref 80.0–100.0)
pO2, Arterial: 75.1 mmHg — ABNORMAL LOW (ref 80.0–100.0)

## 2016-03-11 NOTE — Procedures (Signed)
Intubation Procedure Note Tiffany Horne [id number] Jan 04, 1941  Procedure: Intubation Indications: Airway protection and maintenance  Procedure Details Consent: Risks of procedure as well as the alternatives and risks of each were explained to the (patient/caregiver).  Consent for procedure obtained. Time Out: Verified patient identification, verified procedure, site/side was marked, verified correct patient position, special equipment/implants available, medications/allergies/relevent history reviewed, required imaging and test results available.  Performed  Maximum sterile technique was used including gloves, hand hygiene and mask.  MAC    Evaluation Hemodynamic Status: BP stable throughout; O2 sats: stable throughout Patient's Current Condition: stable Complications: No apparent complications Patient did tolerate procedure well. Chest X-ray ordered to verify placement.  CXR: pending.   Tiffany Horne 03/11/2016

## 2016-03-11 NOTE — Consult Note (Signed)
Name: Tiffany Horne MRN: [id number] DOB: 06/07/1941    ADMISSION DATE:  02/11/2016 CONSULTATION DATE:  7/27  REFERRING MD :  Sharyon Medicus   CHIEF COMPLAINT:  Respiratory failure, AMS   BRIEF PATIENT DESCRIPTION:  75yo female with hx COPD, chronic abd pain, initially admitted 6/17 to outside hospital with incarcerated small bowel obstruction.  She underwent exploratory lap with bowel resection. Course was c/b acute respiratory failure r/t aspiration and significant critical illness polymyopathy.  She was successfully extubated and was tx 6/28 to [facility] for ongoing rehab efforts.  She has not made significant improvement.  She has had worsening weakness and likely ongoing aspiration.  PEG was planned but was held r/t thrombocytopenia. On 7/27 she had progressive respiratory distress with worsening mental status and PCCM was called for intubation and vent management.    SIGNIFICANT EVENTS    STUDIES:     HISTORY OF PRESENT ILLNESS:  75yo female with hx COPD, chronic abd pain, initially admitted 6/17 to outside hospital with incarcerated small bowel obstruction.  She underwent exploratory lap with bowel resection. Course was c/b acute respiratory failure r/t aspiration and significant critical illness polymyopathy.  She was successfully extubated and was tx 6/28 to [facility] for ongoing rehab efforts.  She has not made significant improvement.  She has had worsening weakness and likely ongoing aspiration.  On 7/27 she had progressive respiratory distress with worsening mental status and PCCM was called for intubation and vent management.   PAST MEDICAL HISTORY :   has a past medical history of AAA (abdominal aortic aneurysm) (HCC); Anxiety disorder; Back pain; COPD (chronic obstructive pulmonary disease) (HCC); Hyperlipidemia; Lumbar strain; and Spinal stenosis.  has a past surgical history that includes oophorectomy (Left); Gallbladder surgery; Abdominal hysterectomy; and Tonsillectomy. Prior to  Admission medications   Medication Sig Start Date End Date Taking? Authorizing Provider  acetaminophen (TYLENOL) 650 MG CR tablet Take 650 mg by mouth every 8 (eight) hours as needed for pain.    Historical Provider, MD  ALPRAZolam Prudy Feeler) 0.5 MG tablet Take 0.5 mg by mouth at bedtime as needed for anxiety.    Historical Provider, MD  ATORVASTATIN CALCIUM PO Take by mouth.    Historical Provider, MD  CALCIUM PO Take by mouth.    Historical Provider, MD  celecoxib (CELEBREX) 100 MG capsule Take 1 capsule (100 mg total) by mouth 2 (two) times daily. 01/28/16   Levert Feinstein, MD  Cholecalciferol (VITAMIN D-3 PO) Take by mouth.    Historical Provider, MD  diazepam (VALIUM) 10 MG tablet Take 10 mg by mouth every 6 (six) hours as needed for anxiety.    Historical Provider, MD  DULoxetine (CYMBALTA) 30 MG capsule Take 2 capsules (60 mg total) by mouth daily. 01/20/16   Levert Feinstein, MD  fentaNYL (DURAGESIC - DOSED MCG/HR) 50 MCG/HR Place 1 patch (50 mcg total) onto the skin every 3 (three) days. 01/28/16   Levert Feinstein, MD  gabapentin (NEURONTIN) 100 MG capsule Take 100 mg by mouth.    Historical Provider, MD  HYDROcodone-acetaminophen (NORCO/VICODIN) 5-325 MG tablet Take one tablet every 8 hours as needed for pain. 01/07/16   Asa Lente, MD  Omega-3 Fatty Acids (FISH OIL) 1000 MG CAPS Take by mouth.    Historical Provider, MD  ondansetron (ZOFRAN) 4 MG tablet Take 4 mg by mouth every 8 (eight) hours as needed for nausea or vomiting.    Historical Provider, MD  ondansetron (ZOFRAN) 8 MG tablet Take 8 mg by mouth  every 8 (eight) hours as needed for nausea or vomiting.    Historical Provider, MD  terbinafine (LAMISIL) 250 MG tablet Take 250 mg by mouth daily. Reported on 12/11/2015    Historical Provider, MD   No Known Allergies  FAMILY HISTORY:  family history includes Dementia in her mother; Heart attack in her father; Heart disease in her daughter. SOCIAL HISTORY:  reports that she has been smoking Cigarettes.   She has been smoking about 1.50 packs per day. She does not have any smokeless tobacco history on file. She reports that she drinks alcohol. She reports that she does not use drugs.  REVIEW OF SYSTEMS:   Unable.  Pt lethargic.   SUBJECTIVE:   VITAL SIGNS:  Reviewed at bedside.  Abnormal values appear in Imp/Plan.   PHYSICAL EXAMINATION: General:  Frail, chronically ill appearing female, obtunded  Neuro:  Obtunded, not protecting airway, grimaces to pain  HEENT:  Mm moist, no JVD  Cardiovascular:  s1s2 rrr Lungs:  resps labored, coarse throughout  Abdomen:  Round, soft, tender  Musculoskeletal:  Warm and dry, 2+ BLE edema   Recent Labs Lab 03/08/16 0645 03/09/16 2258 03/10/16 0645  NA 138 144 149*  K 3.8 4.0 3.8  CL 103 113* 113*  CO2 28 23 27   BUN 28* 24* 21*  CREATININE 0.37* 0.48 0.39*  GLUCOSE 122* 149* 89    Recent Labs Lab 03/09/16 0630 03/09/16 2258 03/10/16 0645  HGB 6.8* 8.6* 7.5*  HCT 21.8* 27.3* 23.7*  WBC 7.6 5.8 5.0  PLT 103* 91* 92*   Dg Chest Port 1 View  Result Date: 03/10/2016 CLINICAL DATA:  Pneumonia EXAM: PORTABLE CHEST 1 VIEW COMPARISON:  03/01/2016 FINDINGS: Mild patchy right lower lobe opacity, suspicious for pneumonia. Additional chronic subpleural reticulation/ fibrosis, upper lobe predominant, suggesting chronic interstitial lung disease. No pleural effusion or pneumothorax. The heart is top-normal in size. Left arm PICC terminates in the lower SVC. Enteric tube terminates in the distal gastric body. IMPRESSION: Mild patchy right lower lobe opacity, suspicious for pneumonia. 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 Electronically Signed   By: Charline Bills M.D.   On: 03/10/2016 18:08   ASSESSMENT / PLAN:  Acute respiratory failure r/t HCAP v aspiration - suspect aspiration  AMS - r/t respiratory failure, sepsis   PLAN -  Intubate now  Vent support - 8cc/kg  F/u CXR now confirm ETT  F/u ABG now  Nutritional support  PEG  placement  Will likely need trach  abx per primary to cover aspiration PNA- can likely d/c flagyl  PRN sedation on vent    Dirk Dress, NP 03/11/2016  1:02 PM Pager: [phone] or [phone]  Attending Note:  75 year old female with incarcerated bowel s/p surgery who presents to [facility] from outside hospital for critical illness polymyopathy, decompensated and became much weaker.  Unable to protect her airway and very likely aspirated.  PCCM consulted for respiratory failure.  Patient was clearly not protecting her airway on exam.  Decision was made to intubate.  Will likely need a tracheostomy next week.  [facility]-MD clearing with family.  Place on full vent support and PRN sedation.  Continue abx.  F/U on cultures.  PCCM will evaluate again next week for tracheostomy.  The patient is critically ill with multiple organ systems failure and requires high complexity decision making for assessment and support, frequent evaluation and titration of therapies, application of advanced monitoring technologies and extensive interpretation of multiple databases.   Critical  Care Time devoted to patient care services described in this note is  35  Minutes. This time reflects time of care of this signee Dr [name]. This critical care time does not reflect procedure time, or teaching time or supervisory time of PA/NP/Med student/Med Resident etc but could involve care discussion time.  Alyson Reedy, M.D. [facility] Pulmonary/Critical Care Medicine. Pager: [phone]. After hours pager: [phone].

## 2016-03-12 ENCOUNTER — Other Ambulatory Visit (HOSPITAL_COMMUNITY): Payer: Self-pay

## 2016-03-12 ENCOUNTER — Encounter: Payer: Self-pay | Admitting: Radiology

## 2016-03-12 DIAGNOSIS — J189 Pneumonia, unspecified organism: Secondary | ICD-10-CM | POA: Diagnosis not present

## 2016-03-12 DIAGNOSIS — J9601 Acute respiratory failure with hypoxia: Secondary | ICD-10-CM | POA: Diagnosis not present

## 2016-03-12 LAB — MAGNESIUM: MAGNESIUM: 1.3 mg/dL — AB (ref 1.7–2.4)

## 2016-03-12 LAB — VANCOMYCIN, TROUGH
VANCOMYCIN TR: 31 ug/mL — AB (ref 15–20)
Vancomycin Tr: 18 ug/mL (ref 15–20)

## 2016-03-12 MED ORDER — IOPAMIDOL (ISOVUE-300) INJECTION 61%
100.0000 mL | Freq: Once | INTRAVENOUS | Status: DC | PRN
Start: 2016-03-12 — End: 2016-03-28

## 2016-03-12 NOTE — Consult Note (Signed)
Name: Tiffany Horne MRN: [id number] DOB: [birth year]    ADMISSION DATE:  02/11/2016 CONSULTATION DATE:  7/27  REFERRING MD :  Sharyon Medicus   CHIEF COMPLAINT:  Respiratory failure, AMS   BRIEF PATIENT DESCRIPTION:  75yo female with hx COPD, chronic abd pain, initially admitted 6/17 to outside hospital with incarcerated small bowel obstruction.  She underwent exploratory lap with bowel resection. Course was c/b acute respiratory failure r/t aspiration and significant critical illness polymyopathy.  She was successfully extubated and was tx 6/28 to [facility] for ongoing rehab efforts.  She has not made significant improvement.  She has had worsening weakness and likely ongoing aspiration.  PEG was planned but was held r/t thrombocytopenia. On 7/27 she had progressive respiratory distress with worsening mental status and PCCM was called for intubation and vent management.    SUBJECTIVE: Patient intubated yesterday. No other acute events overnight.  REVIEW OF SYSTEMS:  Unable to assess with the patient intubated.  VITAL SIGNS:  Sat 98-100% on FiO2 0.4 HR 84  VENTILATOR:  TV 480cc   RR 20   PEEP 7   FiO2 0.4  PHYSICAL EXAMINATION: General:  Alert female. No distress. Daughter is at bedside. Neuro:  Patient attempting to speak. It tends to voice. HEENT: Endotracheal tube in place. No scleral icterus. Pupils equal and symmetric. Cardiovascular:  Regular rate. No appreciable JVD. Normal S1 & S2. Pulmonary: Coarse breath sounds bilaterally. Symmetric chest wall rise on ventilator. Abdomen:  Soft. Protuberant. Normal bowel sounds. Integument: Warm and dry. No rash on exposed skin.   Recent Labs Lab 03/08/16 0645 03/09/16 2258 03/10/16 0645  NA 138 144 149*  K 3.8 4.0 3.8  CL 103 113* 113*  CO2 28 23 27   BUN 28* 24* 21*  CREATININE 0.37* 0.48 0.39*  GLUCOSE 122* 149* 89    Recent Labs Lab 03/09/16 0630 03/09/16 2258 03/10/16 0645  HGB 6.8* 8.6* 7.5*  HCT 21.8* 27.3* 23.7*  WBC 7.6  5.8 5.0  PLT 103* 91* 92*   Dg Chest Port 1 View  Result Date: 03/11/2016 CLINICAL DATA:  Intubation adjustment. EXAM: PORTABLE CHEST 1 VIEW COMPARISON:  03/11/2016 FINDINGS: An endotracheal tube is in place with tip 1.5 cm above the carina, repositioned. Nasogastric tube tip is off the image beyond the gastroesophageal junction. A left-sided PICC line tip overlies the superior vena cava. Heart size is upper normal. Patchy infiltrates are identified throughout the lungs bilaterally and appear stable. IMPRESSION: Repositioned endotracheal tube, tip 1.5 cm above the carina. Persistent bilateral pulmonary infiltrates. Electronically Signed   By: Norva Pavlov M.D.   On: 03/11/2016 14:43  Dg Chest Port 1 View  Result Date: 03/11/2016 CLINICAL DATA:  Intubation.  Pneumonia. EXAM: PORTABLE CHEST 1 VIEW COMPARISON:  03/10/2016 FINDINGS: Endotracheal tube is been inserted and the tip is in the origin of the right mainstem bronchus. It needs to be retracted approximately 3 cm. Heart size and vascularity are normal. There has been progression of the patchy bilateral diffuse pulmonary infiltrates. No appreciable effusions. NG tube tip below the diaphragm. IMPRESSION: 1. Progression of diffuse bilateral pulmonary infiltrates. 2. Endotracheal tube in the right mainstem bronchus. Critical Value/emergent results were called by telephone at the time of interpretation on 03/11/2016 at 2:05 pm to [facility], RN , who verbally acknowledged these results. Electronically Signed   By: Francene Boyers M.D.   On: 03/11/2016 14:07  Dg Chest Port 1 View  Result Date: 03/10/2016 CLINICAL DATA:  Pneumonia EXAM: PORTABLE CHEST 1 VIEW  COMPARISON:  03/01/2016 FINDINGS: Mild patchy right lower lobe opacity, suspicious for pneumonia. Additional chronic subpleural reticulation/ fibrosis, upper lobe predominant, suggesting chronic interstitial lung disease. No pleural effusion or pneumothorax. The heart is top-normal in size. Left arm PICC  terminates in the lower SVC. Enteric tube terminates in the distal gastric body. IMPRESSION: Mild patchy right lower lobe opacity, suspicious for pneumonia. 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 Electronically Signed   By: Charline Bills M.D.   On: 03/10/2016 18:08   ASSESSMENT / PLAN: 75 year old female with acute hypoxic respiratory failure and aspiration. Likely a component of respiratory muscle weakness as well.   Patient has a known history of COPD but no signs of exacerbation at this time. Mental status seems to have improved on mechanical ventilation. Discussed the need for tracheostomy placement with daughter at bedside at length explaining the mechanics of the tracheostomy as well as the decreased need for sedation and potential for future removal as her ankle status hopefully improves.   1. Acute hypoxic respiratory failure: Recommend continuing to wean FiO2. Plan for tracheostomy placement next week. Recommend initiating pressure support wean wall maintaining patient comfort, hemodynamic stability, and minute ventilation. 2. Aspiration Pneumonia/HCAP:  Defer to primary service on antibiotic selection. Ordering Stat Tracheal Aspirate.  Rest as per primary service.   Donna Christen Jamison Neighbor, M.D. [facility] & Critical Care Pager:  [phone] After 3pm or if no response, call [phone] 1:46 PM 03/12/16

## 2016-03-13 LAB — CBC
HEMATOCRIT: 20.5 % — AB (ref 36.0–46.0)
Hemoglobin: 6.7 g/dL — CL (ref 12.0–15.0)
MCH: 29.6 pg (ref 26.0–34.0)
MCHC: 32.7 g/dL (ref 30.0–36.0)
MCV: 90.7 fL (ref 78.0–100.0)
Platelets: 163 10*3/uL (ref 150–400)
RBC: 2.26 MIL/uL — ABNORMAL LOW (ref 3.87–5.11)
RDW: 16.2 % — AB (ref 11.5–15.5)
WBC: 7.8 10*3/uL (ref 4.0–10.5)

## 2016-03-13 LAB — MAGNESIUM: Magnesium: 1.3 mg/dL — ABNORMAL LOW (ref 1.7–2.4)

## 2016-03-13 LAB — PREPARE RBC (CROSSMATCH)

## 2016-03-14 LAB — TYPE AND SCREEN
ABO/RH(D): O NEG
Antibody Screen: NEGATIVE
Unit division: 0

## 2016-03-14 LAB — HEMOGLOBIN AND HEMATOCRIT, BLOOD
HCT: 25.1 % — ABNORMAL LOW (ref 36.0–46.0)
Hemoglobin: 8.2 g/dL — ABNORMAL LOW (ref 12.0–15.0)

## 2016-03-14 LAB — URINE CULTURE: Culture: 50000 — AB

## 2016-03-14 LAB — MAGNESIUM: Magnesium: 2.7 mg/dL — ABNORMAL HIGH (ref 1.7–2.4)

## 2016-03-14 LAB — VANCOMYCIN, TROUGH: VANCOMYCIN TR: 39 ug/mL — AB (ref 15–20)

## 2016-03-15 LAB — CULTURE, RESPIRATORY

## 2016-03-15 LAB — CULTURE, BLOOD (ROUTINE X 2)
CULTURE: NO GROWTH
Culture: NO GROWTH

## 2016-03-15 LAB — CULTURE, RESPIRATORY W GRAM STAIN

## 2016-03-15 LAB — VANCOMYCIN, TROUGH: Vancomycin Tr: 26 ug/mL (ref 15–20)

## 2016-03-16 DIAGNOSIS — J9600 Acute respiratory failure, unspecified whether with hypoxia or hypercapnia: Secondary | ICD-10-CM | POA: Diagnosis not present

## 2016-03-16 LAB — BASIC METABOLIC PANEL
Anion gap: 8 (ref 5–15)
BUN: 37 mg/dL — ABNORMAL HIGH (ref 6–20)
BUN: 61 mg/dL — AB (ref 6–20)
CHLORIDE: 97 mmol/L — AB (ref 101–111)
CO2: 16 mmol/L — ABNORMAL LOW (ref 22–32)
CO2: 40 mmol/L — ABNORMAL HIGH (ref 22–32)
CREATININE: 0.96 mg/dL (ref 0.44–1.00)
CREATININE: 1.01 mg/dL — AB (ref 0.44–1.00)
Calcium: 7.5 mg/dL — ABNORMAL LOW (ref 8.9–10.3)
Calcium: 8.2 mg/dL — ABNORMAL LOW (ref 8.9–10.3)
GFR calc Af Amer: >60 mL/min (ref >60)
GFR, EST NON AFRICAN AMERICAN: 56 mL/min — AB (ref >60)
GFR, EST NON AFRICAN AMERICAN: 53 mL/min — AB (ref >60)
Glucose, Bld: 107 mg/dL — ABNORMAL HIGH (ref 65–99)
Glucose, Bld: 142 mg/dL — ABNORMAL HIGH (ref 65–99)
POTASSIUM: 2.8 mmol/L — AB (ref 3.5–5.1)
POTASSIUM: 3.7 mmol/L (ref 3.5–5.1)
SODIUM: 164 mmol/L — AB (ref 135–145)
SODIUM: 145 mmol/L (ref 135–145)

## 2016-03-16 LAB — MAGNESIUM: MAGNESIUM: 2.2 mg/dL (ref 1.7–2.4)

## 2016-03-16 NOTE — Progress Notes (Signed)
Name: Tiffany Horne MRN: [id number] DOB: 1940-10-06    ADMISSION DATE:  02/11/2016 CONSULTATION DATE:  7/27  REFERRING MD :  Sharyon Medicus   CHIEF COMPLAINT:  Respiratory failure, AMS   BRIEF PATIENT DESCRIPTION:   75yo female with hx COPD, chronic abd pain, initially admitted 6/17 to outside hospital with incarcerated small bowel obstruction.  She underwent exploratory lap with bowel resection. Course was c/b acute respiratory failure r/t aspiration and significant critical illness polymyopathy.  She was successfully extubated and was tx 6/28 to [facility] for ongoing rehab efforts.  She has not made significant improvement.  She has had worsening weakness and likely ongoing aspiration.  PEG was planned but was held r/t thrombocytopenia. On 7/27 she had progressive respiratory distress with worsening mental status and PCCM was called for intubation and vent management.    SUBJECTIVE:  Appears comfortable, but anxious Failed PSV attempt this am d/t increased RR.    REVIEW OF SYSTEMS:  Unable to assess with the patient intubated.  VITAL SIGNS:   96.4, hr 87 rr 25, 116/61 sats 96% (35%) VENTILATOR:  Vt 480 RR 20+5, peep 5, FIO2 35%  PHYSICAL EXAMINATION: General: very deconditioned & chronically ill appearing white female. No distress.  Neuro:. Awake, interactive but only follows commands on limited basis. Moves all extremities w/out focal weakness.  HEENT: Endotracheal tube in place. No scleral icterus. Pupils equal and reactive  Cardiovascular:  Regular w/out MRG Pulmonary: clear w/ equal chest rise. Her RR picks up w/ any stimulation. F Abdomen:  abd incision site healed. Abd is soft. Not tender to palp. She has been NPO in anticipation of possible Trach today  Integument: MM warm, moist. Skin is intact.     Recent Labs Lab 03/09/16 2258 03/10/16 0645  NA 144 149*  K 4.0 3.8  CL 113* 113*  CO2 23 27  BUN 24* 21*  CREATININE 0.48 0.39*  GLUCOSE 149* 89    Recent Labs Lab  03/09/16 2258 03/10/16 0645 03/13/16 0652 03/14/16 0708  HGB 8.6* 7.5* 6.7* 8.2*  HCT 27.3* 23.7* 20.5* 25.1*  WBC 5.8 5.0 7.8  --   PLT 91* 92* 163  --    No results found.  ASSESSMENT / PLAN:   75 year old female with acute hypoxic respiratory failure and aspiration. Likely a component of respiratory muscle weakness as well.   Patient has a known history of COPD Not in acute exacerbation. She remains on mechanical ventilation; her profound deconditioning and element of . Dr Jamison Neighbor discussed the need for tracheostomy placement with daughter at bedside at length explaining the mechanics of the tracheostomy as well as the decreased need for sedation and potential for future removal as her status hopefully improves.   Acute hypoxic respiratory failure, ventilator dependence Plan  continuing to wean FiO2.  pressure support wean as tolerated Will need ENT for trach   MRSA PNA:   Plan Defer to primary service on antibiotic selection.   Profound physical deconditioning, Protein calorie malnutrition, dysphagia w/ aspiration, anemia of critical illness, hyperchloremia and hypernatremia.  Plan per primary service.    STAFF NOTE: I, Rory Percy, MD FACP have personally reviewed patient's available data, including medical history, events of note, physical examination and test results as part of my evaluation. I have discussed with resident/NP and other care providers such as pharmacist, RN and RRT. In addition, I personally evaluated patient and elicited key findings of: awake, not following commands, ronchi diffuse, on min support, recommend CPAP 5 ps 10-  max 22 attempts = updated failued, she appears also to be under sedated, would recommend fent drip to rass -2 until trach perfromed by ENT, would also recommend abg follow up on rest settings in am and pcxr, consider even to neg balance, d/w RT , RN The patient is critically ill with multiple organ systems failure and requires high  complexity decision making for assessment and support, frequent evaluation and titration of therapies, application of advanced monitoring technologies and extensive interpretation of multiple databases.   Critical Care Time devoted to patient care services described in this note is 30 Minutes. This time reflects time of care of this signee: Rory Percy, MD FACP. This critical care time does not reflect procedure time, or teaching time or supervisory time of PA/NP/Med student/Med Resident etc but could involve care discussion time. Rest per NP/medical resident whose note is outlined above and that I agree with   Mcarthur Rossetti. Tyson Alias, MD, FACP Pgr: [phone] [facility] 03/16/2016 7:35 PM

## 2016-03-17 ENCOUNTER — Encounter (HOSPITAL_COMMUNITY): Payer: Self-pay | Admitting: Certified Registered Nurse Anesthetist

## 2016-03-17 ENCOUNTER — Encounter
Admission: AD | Disposition: A | Discharge status: Other Acute Inpatient Hospital | Payer: Self-pay | Source: Ambulatory Visit | Attending: Internal Medicine

## 2016-03-17 ENCOUNTER — Other Ambulatory Visit (HOSPITAL_COMMUNITY): Payer: Self-pay

## 2016-03-17 HISTORY — PX: TRACHEOSTOMY TUBE PLACEMENT: SHX814

## 2016-03-17 LAB — BLOOD GAS, ARTERIAL
ACID-BASE DEFICIT: 9.1 mmol/L — AB (ref 0.0–2.0)
BICARBONATE: 14.8 meq/L — AB (ref 20.0–24.0)
FIO2: 0.3
MECHVT: 480 mL
O2 Saturation: 97.4 %
PEEP/CPAP: 5 cmH2O
Patient temperature: 98.6
RATE: 20 resp/min
TCO2: 15.6 mmol/L (ref 0–100)
pCO2 arterial: 24.8 mmHg — ABNORMAL LOW (ref 35.0–45.0)
pH, Arterial: 7.393 (ref 7.350–7.450)
pO2, Arterial: 95.7 mmHg (ref 80.0–100.0)

## 2016-03-17 SURGERY — CREATION, TRACHEOSTOMY
Anesthesia: General

## 2016-03-17 MED ORDER — FENTANYL CITRATE (PF) 100 MCG/2ML IJ SOLN
INTRAMUSCULAR | Status: DC | PRN
  Administered 2016-03-17: 50 ug via INTRAVENOUS

## 2016-03-17 MED ORDER — BUPIVACAINE-EPINEPHRINE 0.5% -1:200000 IJ SOLN
INTRAMUSCULAR | Status: DC | PRN
  Administered 2016-03-17: 4 mL

## 2016-03-17 MED ORDER — LACTATED RINGERS IV SOLN
INTRAVENOUS | Status: DC | PRN
  Administered 2016-03-17: 13:00:00 via INTRAVENOUS

## 2016-03-17 MED ORDER — PHENYLEPHRINE HCL 10 MG/ML IJ SOLN
INTRAVENOUS | Status: DC | PRN
  Administered 2016-03-17: 50 ug/min via INTRAVENOUS

## 2016-03-17 MED ORDER — FENTANYL CITRATE (PF) 250 MCG/5ML IJ SOLN
INTRAMUSCULAR | Status: AC
  Filled 2016-03-17: qty 5

## 2016-03-17 MED ORDER — ROCURONIUM BROMIDE 50 MG/5ML IV SOLN
INTRAVENOUS | Status: AC
  Filled 2016-03-17: qty 1

## 2016-03-17 MED ORDER — 0.9 % SODIUM CHLORIDE (POUR BTL) OPTIME
TOPICAL | Status: DC | PRN
  Administered 2016-03-17: 1000 mL

## 2016-03-17 MED ORDER — PHENYLEPHRINE HCL 10 MG/ML IJ SOLN
INTRAMUSCULAR | Status: DC | PRN
  Administered 2016-03-17: 80 ug via INTRAVENOUS

## 2016-03-17 MED ORDER — BUPIVACAINE-EPINEPHRINE (PF) 0.25% -1:200000 IJ SOLN
INTRAMUSCULAR | Status: AC
  Filled 2016-03-17: qty 30

## 2016-03-17 MED ORDER — PROPOFOL 10 MG/ML IV BOLUS
INTRAVENOUS | Status: AC
  Filled 2016-03-17: qty 20

## 2016-03-17 SURGICAL SUPPLY — 34 items
ATTRACTOMAT 16X20 MAGNETIC DRP (DRAPES) ×2 IMPLANT
BLADE SURG 15 STRL LF DISP TIS (BLADE) ×1 IMPLANT
BLADE SURG 15 STRL SS (BLADE) ×3
CLEANER TIP ELECTROSURG 2X2 (MISCELLANEOUS) ×3 IMPLANT
COVER SURGICAL LIGHT HANDLE (MISCELLANEOUS) ×3 IMPLANT
ELECT COATED BLADE 2.86 ST (ELECTRODE) ×3 IMPLANT
ELECT REM PT RETURN 9FT ADLT (ELECTROSURGICAL) ×3
ELECTRODE REM PT RTRN 9FT ADLT (ELECTROSURGICAL) ×1 IMPLANT
GAUZE SPONGE 4X4 16PLY XRAY LF (GAUZE/BANDAGES/DRESSINGS) ×3 IMPLANT
GLOVE SS BIOGEL STRL SZ 7.5 (GLOVE) ×1 IMPLANT
GLOVE SUPERSENSE BIOGEL SZ 7.5 (GLOVE) ×2
GOWN STRL REUS W/ TWL LRG LVL3 (GOWN DISPOSABLE) ×1 IMPLANT
GOWN STRL REUS W/ TWL XL LVL3 (GOWN DISPOSABLE) ×1 IMPLANT
GOWN STRL REUS W/TWL LRG LVL3 (GOWN DISPOSABLE) ×3
GOWN STRL REUS W/TWL XL LVL3 (GOWN DISPOSABLE) ×3
HOLDER TRACH TUBE VELCRO 19.5 (MISCELLANEOUS) ×3 IMPLANT
KIT BASIN OR (CUSTOM PROCEDURE TRAY) ×3 IMPLANT
KIT ROOM TURNOVER OR (KITS) ×3 IMPLANT
KIT SUCTION CATH 14FR (SUCTIONS) ×3 IMPLANT
NDL HYPO 25GX1X1/2 BEV (NEEDLE) IMPLANT
NEEDLE HYPO 25GX1X1/2 BEV (NEEDLE) ×3 IMPLANT
NS IRRIG 1000ML POUR BTL (IV SOLUTION) ×3 IMPLANT
PACK EENT II TURBAN DRAPE (CUSTOM PROCEDURE TRAY) ×3 IMPLANT
PENCIL BUTTON HOLSTER BLD 10FT (ELECTRODE) ×3 IMPLANT
SPONGE DRAIN TRACH 4X4 STRL 2S (GAUZE/BANDAGES/DRESSINGS) ×3 IMPLANT
SPONGE INTESTINAL PEANUT (DISPOSABLE) ×3 IMPLANT
SUT SILK 2 0 SH CR/8 (SUTURE) ×3 IMPLANT
SUT SILK 3 0 TIES 10X30 (SUTURE) IMPLANT
SYR 20CC LL (SYRINGE) ×3 IMPLANT
SYR CONTROL 10ML LL (SYRINGE) ×3 IMPLANT
TOWEL OR 17X24 6PK STRL BLUE (TOWEL DISPOSABLE) ×3 IMPLANT
TUBE CONNECTING 12'X1/4 (SUCTIONS) ×1
TUBE CONNECTING 12X1/4 (SUCTIONS) ×2 IMPLANT
TUBE TRACH SHILEY  6 DIST  CUF (TUBING) ×2 IMPLANT

## 2016-03-17 NOTE — Transfer of Care (Signed)
Immediate Anesthesia Transfer of Care Note  Patient: Tiffany Horne  Procedure(s) Performed: Procedure(s): TRACHEOSTOMY (N/A)  Patient Location: [facility] [phone]  Anesthesia Type:General  Level of Consciousness: sedated  Airway & Oxygen Therapy: Patient placed on Ventilator (see vital sign flow sheet for setting)  Post-op Assessment: Report given to RN and Post -op Vital signs reviewed and stable  Post vital signs: Reviewed and stable  Last Vitals: There were no vitals filed for this visit.  Last Pain: There were no vitals filed for this visit.       Complications: No apparent anesthesia complications

## 2016-03-17 NOTE — Consult Note (Signed)
Reason for Consult:evaluate patient for trach Referring Physician: Hijazi  CALEN POSCH is an 75 y.o. female.  H[facility]: [facility] She's had chronic respiratory problems with his of OSA. She was intubated last week and is expected to be on vent for prolonged period of time and difficult airwy management. Tracheostomy was recommended. Patient is taken to the Hospital for placement of tracheostomy.   Past Medical History:  Diagnosis Date  . AAA (abdominal aortic aneurysm) ([place])   . Anxiety disorder   . Back pain   . COPD (chronic obstructive pulmonary disease) ([place])   . Hyperlipidemia   . Lumbar strain   . Spinal stenosis     Past Surgical History:  Procedure Laterality Date  . ABDOMINAL HYSTERECTOMY    . GALLBLADDER SURGERY    . oophorectomy Left   . TONSILLECTOMY      Social History:  reports that she has been smoking Cigarettes.  She has been smoking about 1.50 packs per day. She does not have any smokeless tobacco history on file. She reports that she drinks alcohol. She reports that she does not use drugs.  Allergies: No Known Allergies  Medications: I have reviewed the patient's current medications.  Results for orders placed or performed during the hospital encounter of 02/11/16 (from the past 48 hour(s))  Vancomycin, trough     Status: Abnormal   Collection Time: 03/15/16  7:31 PM  Result Value Ref Range   Vancomycin Tr 26 (HH) 15 - 20 ug/mL    Comment: CRITICAL RESULT CALLED TO, READ BACK BY AND VERIFIED WITH: [facility] RN _0  BY GRINSTEAD,C 7.31.17   Magnesium     Status: None   Collection Time: 03/16/16 11:03 AM  Result Value Ref Range   Magnesium 2.2 1.7 - 2.4 mg/dL  Basic metabolic panel     Status: Abnormal   Collection Time: 03/16/16  2:44 PM  Result Value Ref Range   Sodium 164 (HH) 135 - 145 mmol/L    Comment: CRITICAL RESULT CALLED TO, READ BACK BY AND VERIFIED WITH: ARolla Plate RN [phone] 1623 GREEN R    Potassium 2.8 (L) 3.5 - 5.1 mmol/L   Chloride >130 (HH) 101 - 111 mmol/L    Comment: CRITICAL RESULT CALLED TO, READ BACK BY AND VERIFIED WITH: ARolla Plate RN [phone] 1623 GREEN R    CO2 16 (L) 22 - 32 mmol/L   Glucose, Bld 107 (H) 65 - 99 mg/dL   BUN 37 (H) 6 - 20 mg/dL   Creatinine, Ser 0.96 0.44 - 1.00 mg/dL   Calcium 7.5 (L) 8.9 - 10.3 mg/dL   GFR calc non Af Amer 56 (L) >60 mL/min   GFR calc Af Amer >60 >60 mL/min    Comment: (NOTE) The eGFR has been calculated using the CKD EPI equation. This calculation has not been validated in all clinical situations. eGFR's persistently <60 mL/min signify possible Chronic Kidney Disease.   Basic metabolic panel     Status: Abnormal   Collection Time: 03/16/16  4:48 PM  Result Value Ref Range   Sodium 145 135 - 145 mmol/L    Comment: DELTA CHECK NOTED   Potassium 3.7 3.5 - 5.1 mmol/L    Comment: DELTA CHECK NOTED   Chloride 97 (L) 101 - 111 mmol/L   CO2 40 (H) 22 - 32 mmol/L   Glucose, Bld 142 (H) 65 - 99 mg/dL   BUN 61 (H) 6 - 20 mg/dL   Creatinine,  Ser 1.01 (H) 0.44 - 1.00 mg/dL   Calcium 8.2 (L) 8.9 - 10.3 mg/dL   GFR calc non Af Amer 53 (L) >60 mL/min   GFR calc Af Amer >60 >60 mL/min    Comment: (NOTE) The eGFR has been calculated using the CKD EPI equation. This calculation has not been validated in all clinical situations. eGFR's persistently <60 mL/min signify possible Chronic Kidney Disease.    Anion gap 8 5 - 15  Blood gas, arterial     Status: Abnormal   Collection Time: 03/17/16  6:30 AM  Result Value Ref Range   FIO2 0.30    Delivery systems VENTILATOR    Mode ASSIST CONTROL    VT 480 mL   LHR 20 resp/min   Peep/cpap 5.0 cm H20   pH, Arterial 7.393 7.350 - 7.450   pCO2 arterial 24.8 (L) 35.0 - 45.0 mmHg   pO2, Arterial 95.7 80.0 - 100.0 mmHg   Bicarbonate 14.8 (L) 20.0 - 24.0 mEq/L   TCO2 15.6 0 - 100 mmol/L   Acid-base deficit 9.1 (H) 0.0 - 2.0 mmol/L   O2 Saturation 97.4 %   Patient temperature 98.6     Collection site RIGHT RADIAL    Drawn by COLLECTED BY RT    Sample type ARTERIAL    Allens test (pass/fail) PASS PASS    Dg Chest Port 1 View  Result Date: 03/17/2016 CLINICAL DATA:  Respiratory failure, COPD, sepsis EXAM: PORTABLE CHEST 1 VIEW COMPARISON:  03/12/2016, 03/11/2016 FINDINGS: Endotracheal tube 3.5 cm above the carina. Left PICC line tip proximal SVC. NG tube within the distal stomach. Atherosclerosis noted of the aorta. Interval improvement in the patchy asymmetric bilateral airspace process versus pneumonia. Diffuse vascular and interstitial prominence may represent a component of residual edema. No enlarging effusion or pneumothorax. IMPRESSION: Improving bilateral asymmetric airspace process versus pneumonia. Residual background interstitial edema pattern Thoracic aortic atherosclerosis Electronically Signed   By: Jerilynn Mages.  Shick M.D.   On: 03/17/2016 07:56    ROS:on vent poorly responsive   VO:PFYTWKM intubated on vent NG tube in left nostril Neck without masses. Trach palpably midline.  Assessment/Plan: Chronic respiratory failure on vent.  To OR for tracheosotmy  Jhoselyn Ruffini 03/17/2016, 1:45 PM

## 2016-03-17 NOTE — Anesthesia Postprocedure Evaluation (Signed)
Anesthesia Post Note  Patient: Tiffany Horne  Procedure(s) Performed: Procedure(s) (LRB): TRACHEOSTOMY (N/A)  Patient location during evaluation: SICU Anesthesia Type: General Level of consciousness: awake and alert Pain management: pain level controlled Vital Signs Assessment: post-procedure vital signs reviewed and stable Respiratory status: spontaneous breathing, nonlabored ventilation, respiratory function stable and patient connected to nasal cannula oxygen Cardiovascular status: blood pressure returned to baseline and stable Postop Assessment: no signs of nausea or vomiting Anesthetic complications: no    Last Vitals: There were no vitals filed for this visit.  Last Pain: There were no vitals filed for this visit.               Meilech Virts,JAMES TERRILL

## 2016-03-17 NOTE — Anesthesia Preprocedure Evaluation (Signed)
Anesthesia Evaluation  Patient identified by MRN, date of birth, ID band  Reviewed: Patient's Chart, lab work & pertinent test results  Airway Mallampati: Intubated       Dental   Pulmonary COPD, Current Smoker,  Resp failure and intubated   + rhonchi        Cardiovascular + Peripheral Vascular Disease   Rhythm:Regular Rate:Tachycardia     Neuro/Psych    GI/Hepatic SP laparotomy   Endo/Other    Renal/GU      Musculoskeletal   Abdominal   Peds  Hematology   Anesthesia Other Findings   Reproductive/Obstetrics                             Anesthesia Physical Anesthesia Plan  ASA: IV  Anesthesia Plan: General   Post-op Pain Management:    Induction: Intravenous and Inhalational  Airway Management Planned: Oral ETT  Additional Equipment:   Intra-op Plan:   Post-operative Plan: Post-operative intubation/ventilation  Informed Consent: I have reviewed the patients History and Physical, chart, labs and discussed the procedure including the risks, benefits and alternatives for the proposed anesthesia with the patient or authorized representative who has indicated his/her understanding and acceptance.     Plan Discussed with: CRNA  Anesthesia Plan Comments:         Anesthesia Quick Evaluation

## 2016-03-17 NOTE — Brief Op Note (Signed)
02/11/2016 - 03/17/2016  1:52 PM  PATIENT:  Tiffany Horne  75 y.o. female  PRE-OPERATIVE DIAGNOSIS:  Respiratory Failure  POST-OPERATIVE DIAGNOSIS:  Respiratory Failure  PROCEDURE:  Procedure(s): TRACHEOSTOMY (N/A) # 6 Cuffed Shiley  SURGEON:  Surgeon(s) and Role:    * Drema Halon, MD - Primary  PHYSICIAN ASSISTANT:   ASSISTANTS: none   ANESTHESIA:   general  EBL:  No intake/output data recorded.  BLOOD ADMINISTERED:none  DRAINS: none   LOCAL MEDICATIONS USED:  XYLOCAINE with Epi  4 cc  SPECIMEN:  No Specimen  DISPOSITION OF SPECIMEN:  N/A  COUNTS:  YES  TOURNIQUET:  * No tourniquets in log *  DICTATION: .Other Dictation: Dictation Number [id number]  PLAN OF CARE: Discharge to home after PACU  PATIENT DISPOSITION:  PACU - hemodynamically stable.   Delay start of Pharmacological VTE agent (>24hrs) due to surgical blood loss or risk of bleeding: yes

## 2016-03-18 ENCOUNTER — Encounter (HOSPITAL_COMMUNITY): Payer: Self-pay | Admitting: Otolaryngology

## 2016-03-18 DIAGNOSIS — J9600 Acute respiratory failure, unspecified whether with hypoxia or hypercapnia: Secondary | ICD-10-CM | POA: Diagnosis not present

## 2016-03-18 NOTE — Op Note (Signed)
[name], [name]              ACCOUNT NO.:  [id number]  MEDICAL RECORD NO.:  [id number]  LOCATION:                                 FACILITY:  PHYSICIAN:  Kristine Garbe. Ezzard Standing, M.D.DATE OF BIRTH:  03-Mar-1941  DATE OF PROCEDURE:  03/17/2016 DATE OF DISCHARGE:                              OPERATIVE REPORT   PREOPERATIVE DIAGNOSIS:  Chronic respiratory failure, on the ventilator.  POSTOPERATIVE DIAGNOSIS:  Chronic respiratory failure, on the ventilator.  OPERATION PERFORMED:  Tracheostomy with a #6 Shiley cuffed trach.  SURGEON:  Kristine Garbe. Ezzard Standing, MD  ANESTHESIA:  General endotracheal.  COMPLICATIONS:  None.  BLOOD LOSS:  Minimal.  BRIEF CLINICAL NOTE:  Tiffany Horne is a 75 year old female who was admitted to [facility] a little over a month ago following a complicated course with ischemic bowel status post resection of small intestine x2.  She has had history of obstructive sleep apnea and has been on and off BiPAP.  More recently, she required intubation little over a week ago.  It is recommended that she undergo a tracheostomy, and I was subsequently consulted concerning the tracheostomy in this lady. On exam, trach is midline with no neck masses.  The patient is poorly responsive on the ventilator.  She is taken to the operating room at this time for a tracheotomy.  DESCRIPTION OF PROCEDURE:  The patient was brought from the [facility], remained in her bed.  Roll was placed underneath the shoulders to extend her neck.  The trach was palpable in the midline. The area of incision was marked out and injected with 3 to 4 mL of Xylocaine with epinephrine for hemostasis.  A vertical incision was made just below the cricoid cartilage.  Dissection was carried down through subcutaneous tissue with cautery.  Strap muscles were identified and divided in the midline and retracted laterally with retractors.  The first 3 tracheal rings were identified  just below the cricoid cartilage. A horizontal tracheotomy was performed between the first and second tracheal rings.  The endotracheal tube was removed and a #6 cuffed Shiley tube was inserted without any difficulty.  The patient was ventilated well.  [name] was secured to the neck with four 2-0 silk sutures and Velcro trach collar around the neck.  The patient tolerated the procedure well and subsequently transferred back to [facility].    ______________________________ Kristine Garbe. Ezzard Standing, M.D.   ______________________________ Kristine Garbe. Ezzard Standing, M.D.    CEN/MEDQ  D:  03/17/2016  T:  03/17/2016  Job:  [id number]

## 2016-03-18 NOTE — Progress Notes (Signed)
Name: Tiffany Horne MRN: [id number] DOB: 1941-07-10    ADMISSION DATE:  02/11/2016 CONSULTATION DATE:  7/27  REFERRING MD :  Sharyon Medicus   CHIEF COMPLAINT:  Respiratory failure, AMS   BRIEF PATIENT DESCRIPTION:   75yo female with hx COPD, chronic abd pain, initially admitted 6/17 to outside hospital with incarcerated small bowel obstruction.  She underwent exploratory lap with bowel resection. Course was c/b acute respiratory failure r/t aspiration and significant critical illness polymyopathy.  She was successfully extubated and was tx 6/28 to [facility] for ongoing rehab efforts.  She has not made significant improvement.  She has had worsening weakness and likely ongoing aspiration.  PEG was planned but was held r/t thrombocytopenia. On 7/27 she had progressive respiratory distress with worsening mental status and PCCM was called for intubation and vent management.    SUBJECTIVE:  Comfortable on full vent support  VITAL SIGNS:   HR 66 rr 27 bp 156/83 sats 92% (.28) VENTILATOR:  vt 480 rr 20 + 7 peep 5 fio2 28%  PHYSICAL EXAMINATION: General: weak and deconditioned white female. Resting on full vent support  Neuro:. Awake, weak, intermittently follows commands.  HEENT: # 6 cuffed trach mid-line. Dressing intact. Minimal drainage.  Cardiovascular:  RRR no MRG  Pulmonary: Clear, no accessory use, equal rise on vent  Abdomen:  Soft, not tender. + bowel sound  Integument: warm and dry, + LE edema/ anasarca   Recent Labs Lab 03/16/16 1444 03/16/16 1648  NA 164* 145  K 2.8* 3.7  CL >130* 97*  CO2 16* 40*  BUN 37* 61*  CREATININE 0.96 1.01*  GLUCOSE 107* 142*    Recent Labs Lab 03/13/16 0652 03/14/16 0708  HGB 6.7* 8.2*  HCT 20.5* 25.1*  WBC 7.8  --   PLT 163  --    Dg Chest Port 1 View  Result Date: 03/17/2016 CLINICAL DATA:  Respiratory failure, COPD, sepsis EXAM: PORTABLE CHEST 1 VIEW COMPARISON:  03/12/2016, 03/11/2016 FINDINGS: Endotracheal tube 3.5 cm above the  carina. Left PICC line tip proximal SVC. NG tube within the distal stomach. Atherosclerosis noted of the aorta. Interval improvement in the patchy asymmetric bilateral airspace process versus pneumonia. Diffuse vascular and interstitial prominence may represent a component of residual edema. No enlarging effusion or pneumothorax. IMPRESSION: Improving bilateral asymmetric airspace process versus pneumonia. Residual background interstitial edema pattern Thoracic aortic atherosclerosis Electronically Signed   By: Judie Petit.  Shick M.D.   On: 03/17/2016 07:56   PCXR improving aeration particularly LLL  ASSESSMENT / PLAN:   75 year old female with acute hypoxic respiratory failure and aspiration. Likely a component of respiratory muscle weakness as well.   Patient has a known history of COPD Not in acute exacerbation. She remains on mechanical ventilation d/t profound deconditioning, severe protein calorie malnutrition +/- element of anxiety.  Interval from 8/1 to 8/3: She is now s/p trach. She is calm, RT reports when transitioned to PSV her Vts drop to the 200s and rr increases to 40s. Not anxious though. Concerned that her profound deconditioning is the largest barrier at this point.    Acute hypoxic respiratory failure, ventilator dependence Tracheostomy status (placed by Ezzard Standing 8/2) Plan  Cont daily PSV trials Ck cortisol to see if adrenal insuff may be contributing to weaning failure    MRSA PNA:   Plan Defer to primary service on antibiotic selection.   Profound physical deconditioning, Protein calorie malnutrition, dysphagia w/ aspiration, anemia of critical illness, hyperchloremia and hypernatremia.  Plan per primary service.  Simonne Martinet ACNP-BC [facility] Pulmonary/Critical Care Pager # [phone] OR # [phone] if no answer   STAFF NOTE: I, Rory Percy, MD FACP have personally reviewed patient's available data, including medical history, events of note, physical examination and  test results as part of my evaluation. I have discussed with resident/NP and other care providers such as pharmacist, RN and RRT. In addition, I personally evaluated patient and elicited key findings of: open mouth, trach clean, lethargic sedated, coarse BS, pcxr with int changes, Ct chest had patchy int infiltrates, wean ps 10-12 cpap 5, goal 2 hours once more awake, pcxr over all is improved to some degree, maintain neg balance with lasix, WUA interrupt sedation , Pt consult The patient is critically ill with multiple organ systems failure and requires high complexity decision making for assessment and support, frequent evaluation and titration of therapies, application of advanced monitoring technologies and extensive interpretation of multiple databases.   Critical Care Time devoted to patient care services described in this note is 30 Minutes. This time reflects time of care of this signee: Rory Percy, MD FACP. This critical care time does not reflect procedure time, or teaching time or supervisory time of PA/NP/Med student/Med Resident etc but could involve care discussion time. Rest per NP/medical resident whose note is outlined above and that I agree with   Mcarthur Rossetti. Tyson Alias, MD, FACP Pgr: [phone] [facility] 03/18/2016 12:38 PM

## 2016-03-19 ENCOUNTER — Other Ambulatory Visit (HOSPITAL_COMMUNITY)

## 2016-03-19 LAB — BASIC METABOLIC PANEL
BUN: 50 mg/dL — ABNORMAL HIGH (ref 6–20)
BUN: 49 mg/dL — ABNORMAL HIGH (ref 6–20)
BUN: 51 mg/dL — ABNORMAL HIGH (ref 6–20)
CALCIUM: 7 mg/dL — AB (ref 8.9–10.3)
CALCIUM: 7 mg/dL — AB (ref 8.9–10.3)
CALCIUM: 7.1 mg/dL — AB (ref 8.9–10.3)
CO2: 16 mmol/L — ABNORMAL LOW (ref 22–32)
CO2: 17 mmol/L — AB (ref 22–32)
CO2: 16 mmol/L — AB (ref 22–32)
CREATININE: 1.29 mg/dL — AB (ref 0.44–1.00)
CREATININE: 1.24 mg/dL — AB (ref 0.44–1.00)
CREATININE: 1.29 mg/dL — AB (ref 0.44–1.00)
Chloride: >130 mmol/L (ref 101–111)
Chloride: >130 mmol/L (ref 101–111)
Chloride: >130 mmol/L (ref 101–111)
GFR calc Af Amer: 46 mL/min — ABNORMAL LOW (ref >60)
GFR, EST AFRICAN AMERICAN: 46 mL/min — AB (ref >60)
GFR, EST AFRICAN AMERICAN: 48 mL/min — AB (ref >60)
GFR, EST NON AFRICAN AMERICAN: 39 mL/min — AB (ref >60)
GFR, EST NON AFRICAN AMERICAN: 41 mL/min — AB (ref >60)
GFR, EST NON AFRICAN AMERICAN: 39 mL/min — AB (ref >60)
GLUCOSE: 96 mg/dL (ref 65–99)
GLUCOSE: 134 mg/dL — AB (ref 65–99)
Glucose, Bld: 106 mg/dL — ABNORMAL HIGH (ref 65–99)
Potassium: 2.2 mmol/L — CL (ref 3.5–5.1)
Potassium: 2.4 mmol/L — CL (ref 3.5–5.1)
Potassium: 2.7 mmol/L — CL (ref 3.5–5.1)
Sodium: 168 mmol/L (ref 135–145)
Sodium: 168 mmol/L (ref 135–145)
Sodium: 168 mmol/L (ref 135–145)

## 2016-03-19 LAB — CBC
HCT: 22.5 % — ABNORMAL LOW (ref 36.0–46.0)
Hemoglobin: 7.5 g/dL — ABNORMAL LOW (ref 12.0–15.0)
MCH: 29.6 pg (ref 26.0–34.0)
MCHC: 33.3 g/dL (ref 30.0–36.0)
MCV: 88.9 fL (ref 78.0–100.0)
PLATELETS: 265 10*3/uL (ref 150–400)
RBC: 2.53 MIL/uL — ABNORMAL LOW (ref 3.87–5.11)
RDW: 18 % — AB (ref 11.5–15.5)
WBC: 11.4 10*3/uL — AB (ref 4.0–10.5)

## 2016-03-20 LAB — BASIC METABOLIC PANEL
BUN: 47 mg/dL — ABNORMAL HIGH (ref 6–20)
BUN: 43 mg/dL — AB (ref 6–20)
CALCIUM: 7 mg/dL — AB (ref 8.9–10.3)
CALCIUM: 7.2 mg/dL — AB (ref 8.9–10.3)
CO2: 14 mmol/L — AB (ref 22–32)
CO2: 16 mmol/L — AB (ref 22–32)
CREATININE: 1.08 mg/dL — AB (ref 0.44–1.00)
Chloride: >130 mmol/L (ref 101–111)
Creatinine, Ser: 1.05 mg/dL — ABNORMAL HIGH (ref 0.44–1.00)
GFR calc Af Amer: 57 mL/min — ABNORMAL LOW (ref >60)
GFR calc Af Amer: 59 mL/min — ABNORMAL LOW (ref >60)
GFR calc non Af Amer: 49 mL/min — ABNORMAL LOW (ref >60)
GFR, EST NON AFRICAN AMERICAN: 51 mL/min — AB (ref >60)
GLUCOSE: 132 mg/dL — AB (ref 65–99)
GLUCOSE: 186 mg/dL — AB (ref 65–99)
Potassium: 3.6 mmol/L (ref 3.5–5.1)
Potassium: 3.4 mmol/L — ABNORMAL LOW (ref 3.5–5.1)
Sodium: 164 mmol/L (ref 135–145)
Sodium: 164 mmol/L (ref 135–145)

## 2016-03-21 LAB — URINALYSIS, ROUTINE W REFLEX MICROSCOPIC
Bilirubin Urine: NEGATIVE
Glucose, UA: NEGATIVE mg/dL
Ketones, ur: NEGATIVE mg/dL
NITRITE: NEGATIVE
PROTEIN: 100 mg/dL — AB
SPECIFIC GRAVITY, URINE: 1.022 (ref 1.005–1.030)
pH: 5.5 (ref 5.0–8.0)

## 2016-03-21 LAB — BASIC METABOLIC PANEL
BUN: 37 mg/dL — ABNORMAL HIGH (ref 6–20)
CO2: 15 mmol/L — AB (ref 22–32)
CREATININE: 1.03 mg/dL — AB (ref 0.44–1.00)
Calcium: 7.2 mg/dL — ABNORMAL LOW (ref 8.9–10.3)
GFR calc non Af Amer: 52 mL/min — ABNORMAL LOW (ref >60)
Glucose, Bld: 166 mg/dL — ABNORMAL HIGH (ref 65–99)
POTASSIUM: 3.4 mmol/L — AB (ref 3.5–5.1)
SODIUM: 159 mmol/L — AB (ref 135–145)

## 2016-03-21 LAB — URINE MICROSCOPIC-ADD ON

## 2016-03-22 ENCOUNTER — Other Ambulatory Visit (HOSPITAL_COMMUNITY): Payer: Self-pay

## 2016-03-22 LAB — URINE CULTURE: Culture: >=100000 — AB

## 2016-03-22 LAB — PREPARE RBC (CROSSMATCH)

## 2016-03-22 LAB — BASIC METABOLIC PANEL
BUN: 30 mg/dL — ABNORMAL HIGH (ref 6–20)
CO2: 15 mmol/L — ABNORMAL LOW (ref 22–32)
Calcium: 7.2 mg/dL — ABNORMAL LOW (ref 8.9–10.3)
Creatinine, Ser: 0.79 mg/dL (ref 0.44–1.00)
Glucose, Bld: 139 mg/dL — ABNORMAL HIGH (ref 65–99)
POTASSIUM: 3.4 mmol/L — AB (ref 3.5–5.1)
SODIUM: 150 mmol/L — AB (ref 135–145)

## 2016-03-22 LAB — CBC
HEMATOCRIT: 17 % — AB (ref 36.0–46.0)
HEMOGLOBIN: 5.3 g/dL — AB (ref 12.0–15.0)
MCH: 28.6 pg (ref 26.0–34.0)
MCHC: 31.2 g/dL (ref 30.0–36.0)
MCV: 91.9 fL (ref 78.0–100.0)
Platelets: 266 10*3/uL (ref 150–400)
RBC: 1.85 MIL/uL — ABNORMAL LOW (ref 3.87–5.11)
RDW: 18.8 % — ABNORMAL HIGH (ref 11.5–15.5)
WBC: 8.9 10*3/uL (ref 4.0–10.5)

## 2016-03-22 LAB — PROTIME-INR
INR: 5.34 — AB
INR: 5.29
Prothrombin Time: 50.5 seconds — ABNORMAL HIGH (ref 11.4–15.2)
Prothrombin Time: 50 seconds — ABNORMAL HIGH (ref 11.4–15.2)

## 2016-03-22 LAB — HEMOGLOBIN AND HEMATOCRIT, BLOOD
HCT: 19.3 % — ABNORMAL LOW (ref 36.0–46.0)
HEMOGLOBIN: 5.9 g/dL — AB (ref 12.0–15.0)

## 2016-03-22 NOTE — Progress Notes (Signed)
Patient ID: Tiffany EgeBarbara A Horne, female   DOB: 03/06/1941, 75 y.o.   MRN: [id number]   Pt has been on IR ;ist for G tube for close to 1 month  Hg 5.3 today +UTI INR 5.3  I have cancelled procedure for now RN aware [facility] MD will re order when appropriate

## 2016-03-23 DIAGNOSIS — J9601 Acute respiratory failure with hypoxia: Secondary | ICD-10-CM | POA: Diagnosis not present

## 2016-03-23 DIAGNOSIS — Z930 Tracheostomy status: Secondary | ICD-10-CM

## 2016-03-23 LAB — CBC
HCT: 29.9 % — ABNORMAL LOW (ref 36.0–46.0)
HEMOGLOBIN: 9.6 g/dL — AB (ref 12.0–15.0)
MCH: 28.9 pg (ref 26.0–34.0)
MCHC: 32.1 g/dL (ref 30.0–36.0)
MCV: 90.1 fL (ref 78.0–100.0)
PLATELETS: 287 10*3/uL (ref 150–400)
RBC: 3.32 MIL/uL — ABNORMAL LOW (ref 3.87–5.11)
RDW: 16.8 % — ABNORMAL HIGH (ref 11.5–15.5)
WBC: 10.3 10*3/uL (ref 4.0–10.5)

## 2016-03-23 LAB — TYPE AND SCREEN
ABO/RH(D): O NEG
Antibody Screen: NEGATIVE
UNIT DIVISION: 0
Unit division: 0

## 2016-03-23 LAB — PROTIME-INR
INR: 2.55
PROTHROMBIN TIME: 27.9 s — AB (ref 11.4–15.2)

## 2016-03-23 LAB — BASIC METABOLIC PANEL
BUN: 24 mg/dL — AB (ref 6–20)
CALCIUM: 7.6 mg/dL — AB (ref 8.9–10.3)
CO2: 14 mmol/L — AB (ref 22–32)
CREATININE: 0.72 mg/dL (ref 0.44–1.00)
GFR calc non Af Amer: >60 mL/min (ref >60)
GLUCOSE: 108 mg/dL — AB (ref 65–99)
Potassium: 3.6 mmol/L (ref 3.5–5.1)
SODIUM: 153 mmol/L — AB (ref 135–145)

## 2016-03-23 LAB — PREPARE FRESH FROZEN PLASMA: UNIT DIVISION: 0

## 2016-03-23 NOTE — Progress Notes (Signed)
Name: Tiffany Horne MRN: [id number] DOB: [birth year]    ADMISSION DATE:  02/11/2016 CONSULTATION DATE:  7/27  REFERRING MD :  Sharyon Medicus   CHIEF COMPLAINT:  Respiratory failure, AMS   BRIEF PATIENT DESCRIPTION:   75 y/o female with hx COPD, chronic abd pain, initially admitted 6/17 to outside hospital with incarcerated small bowel obstruction.  She underwent exploratory lap with bowel resection. Course was c/b acute respiratory failure r/t aspiration and significant critical illness polymyopathy.  She was successfully extubated and was tx 6/28 to [facility] for ongoing rehab efforts.  She has not made significant improvement.  She has had worsening weakness and likely ongoing aspiration.  PEG was planned but was held r/t thrombocytopenia. On 7/27 she had progressive respiratory distress with worsening mental status and PCCM was called for intubation and vent management.    SUBJECTIVE:  Remains on vent / ETT.  Weaning on PSV 12 hours on 8/7.  Received 2 units PRBC's, FFP x1 8/7 with follow up Hgb of 5.9.  No repeat completed 8/8.     VITAL SIGNS:  101, 85, 30, 126/62, 100%  VENTILATOR: Vt 400 / R 20 / PEEP 5 / 28%  PHYSICAL EXAMINATION: General: weak and deconditioned white female. Resting on full vent support  Neuro:. Awake, generalized weakness, makes eye contact / tracks HEENT: # 6 cuffed trach mid-line with sutures in place. Dressing intact. Minimal drainage.  Cardiovascular:  RRR no MRG  Pulmonary: non-labored on vent, lungs bilaterally coarse Abdomen:  Soft, not tender. + bowel sound  Integument: warm and dry, + LE edema/ anasarca   Recent Labs Lab 03/20/16 1706 03/21/16 0600 03/22/16 0621  NA 164* 159* 150*  K 3.4* 3.4* 3.4*  CL >130* >130* >130*  CO2 16* 15* 15*  BUN 43* 37* 30*  CREATININE 1.05* 1.03* 0.79  GLUCOSE 186* 166* 139*    Recent Labs Lab 03/19/16 0545 03/22/16 0621 03/22/16 0932  HGB 7.5* 5.3* 5.9*  HCT 22.5* 17.0* 19.3*  WBC 11.4* 8.9  --   PLT 265  266  --    Dg Chest Port 1 View  Result Date: 03/22/2016 CLINICAL DATA:  Respiratory failure.  History of COPD. EXAM: PORTABLE CHEST 1 VIEW COMPARISON:  03/17/2016 FINDINGS: There has been interval placement of tracheostomy tube with tip 3.2 cm from the carina. Left PICC line is unchanged. Enteric catheter tip is collimated off the image. Cardiomediastinal silhouette is normal. Mediastinal contours appear intact. Atherosclerotic disease of the aorta noted. There is no evidence of pneumothorax. Bilateral patchy alveolar and interstitial opacities persist. Osseous structures are without acute abnormality. Soft tissues are grossly normal. IMPRESSION: Post placement of tracheostomy tube in satisfactory radiographic position. No evidence of pneumothorax. Persistent bilateral patchy alveolar and interstitial opacities which may represent asymmetric pulmonary edema versus infectious consolidation. Electronically Signed   By: Ted Mcalpine M.D.   On: 03/22/2016 08:19    ASSESSMENT / PLAN:   75 year old female with acute hypoxic respiratory failure and aspiration. Likely a component of respiratory muscle weakness as well.   Patient has a known history of COPD Not in acute exacerbation. She remains on mechanical ventilation d/t profound deconditioning, severe protein calorie malnutrition +/- element of anxiety. Tracheostomy placed 8/2 per Dr. Ezzard Standing.     Acute hypoxic respiratory failure, ventilator dependence - secondary to MRSA PNA, deconditioning  Tracheostomy status (placed by Ezzard Standing 8/2) Plan  Cont daily PSV trials as tolerated  Vent wean protocol Intermittent CXR  Push PT efforts, mobilize OOB Duoneb  TID + PRN   PRN versed / fentanyl for sedation    MRSA PNA - note multiple allergies  Plan Defer to primary service on antibiotic selection.   Profound physical deconditioning, Protein calorie malnutrition, dysphagia w/ aspiration, anemia - ? GIB + critical illness, hyperchloremia and  hypernatremia.  Plan Per primary service.  Defer transfusion of PRBC's to primary SVC Recommend post transfusion follow up CBC  Consider changing IVF to D5W given hypernatremia / hyperchloremia + add free water flushes   Canary BrimBrandi Ollis, NP-C [facility] Pgr: [phone] or if no answer [phone] 03/23/2016, 11:02 AM

## 2016-03-24 LAB — PROTIME-INR
INR: 1.61
Prothrombin Time: 19.4 seconds — ABNORMAL HIGH (ref 11.4–15.2)

## 2016-03-24 LAB — HEMOGLOBIN AND HEMATOCRIT, BLOOD
HEMATOCRIT: 26 % — AB (ref 36.0–46.0)
Hemoglobin: 8.1 g/dL — ABNORMAL LOW (ref 12.0–15.0)

## 2016-03-25 DIAGNOSIS — J9601 Acute respiratory failure with hypoxia: Secondary | ICD-10-CM | POA: Diagnosis not present

## 2016-03-25 DIAGNOSIS — Z930 Tracheostomy status: Secondary | ICD-10-CM | POA: Diagnosis not present

## 2016-03-25 LAB — BASIC METABOLIC PANEL
BUN: 35 mg/dL — ABNORMAL HIGH (ref 6–20)
CALCIUM: 7.6 mg/dL — AB (ref 8.9–10.3)
CO2: 17 mmol/L — AB (ref 22–32)
Creatinine, Ser: 0.79 mg/dL (ref 0.44–1.00)
GFR calc Af Amer: >60 mL/min (ref >60)
GFR calc non Af Amer: >60 mL/min (ref >60)
GLUCOSE: 158 mg/dL — AB (ref 65–99)
POTASSIUM: 3.2 mmol/L — AB (ref 3.5–5.1)
Sodium: 156 mmol/L — ABNORMAL HIGH (ref 135–145)

## 2016-03-25 LAB — PROTIME-INR
INR: 1.44
Prothrombin Time: 17.7 seconds — ABNORMAL HIGH (ref 11.4–15.2)

## 2016-03-25 NOTE — Progress Notes (Signed)
BRIEF PATIENT DESCRIPTION:   75 y/o female with hx COPD, chronic abd pain, initially admitted 6/17 to outside hospital with incarcerated small bowel obstruction.  She underwent exploratory lap with bowel resection. Course was c/b acute respiratory failure r/t aspiration and significant critical illness polymyopathy.  She was successfully extubated and was tx 6/28 to [facility] for ongoing rehab efforts.  She has not made significant improvement.  She has had worsening weakness and likely ongoing aspiration.  PEG was planned but was held r/t thrombocytopenia. On 7/27 she had progressive respiratory distress with worsening mental status and PCCM was called for intubation and vent management  Tracheostomy  placed on 8/2 She has not made much progress with weaning  On exam-more awake and follows commands ,appears severely deconditioned, anasarca, decreased breath sounds bilateral, sinus tachycardia on monitor  Chest x-ray 8/7-persistent bilateral patchy has interstitial opacities Labs 8/8 show hypernatremia, hyperchloremia, severe anemia improved from 5.9-9.6 INR has decreased with FFP from 5.3-1.6  Continue weaning efforts on PS/CPAP  Per protocol Consider using hypotonic  bicarbonate drip for sodium chloride induced NAG acidosis  Oretha MilchALVA,Tranae Laramie V. MD

## 2016-03-26 LAB — CBC
HCT: 24.6 % — ABNORMAL LOW (ref 36.0–46.0)
Hemoglobin: 7.6 g/dL — ABNORMAL LOW (ref 12.0–15.0)
MCH: 29.1 pg (ref 26.0–34.0)
MCHC: 30.9 g/dL (ref 30.0–36.0)
MCV: 94.3 fL (ref 78.0–100.0)
PLATELETS: 253 10*3/uL (ref 150–400)
RBC: 2.61 MIL/uL — AB (ref 3.87–5.11)
RDW: 17.9 % — ABNORMAL HIGH (ref 11.5–15.5)
WBC: 9.1 10*3/uL (ref 4.0–10.5)

## 2016-03-26 NOTE — Progress Notes (Signed)
Patient ID: Dashauna A FMargie Egeraley, female   DOB: 05/21/1941, 75 y.o.   MRN: [id number]   Request for percutaneous Gastric tube has been re submitted.  Pt now afeb Wbc wnl  Hg 7.6 Hct 24.6 Plt 253  Consent signed and in chart from original request 3 weeks ago  Will check cbc 8/14 am--- Plan for G tube procedure in IR 8/14

## 2016-03-27 LAB — BASIC METABOLIC PANEL
BUN: 31 mg/dL — AB (ref 6–20)
CO2: 16 mmol/L — AB (ref 22–32)
CREATININE: 0.67 mg/dL (ref 0.44–1.00)
Calcium: 7.6 mg/dL — ABNORMAL LOW (ref 8.9–10.3)
GFR calc Af Amer: >60 mL/min (ref >60)
GFR calc non Af Amer: >60 mL/min (ref >60)
GLUCOSE: 192 mg/dL — AB (ref 65–99)
Potassium: 2.8 mmol/L — ABNORMAL LOW (ref 3.5–5.1)
Sodium: 158 mmol/L — ABNORMAL HIGH (ref 135–145)

## 2016-03-27 LAB — CULTURE, RESPIRATORY W GRAM STAIN

## 2016-03-27 LAB — CBC
HCT: 25.3 % — ABNORMAL LOW (ref 36.0–46.0)
HEMOGLOBIN: 7.7 g/dL — AB (ref 12.0–15.0)
MCH: 28.5 pg (ref 26.0–34.0)
MCHC: 30.4 g/dL (ref 30.0–36.0)
MCV: 93.7 fL (ref 78.0–100.0)
Platelets: 283 10*3/uL (ref 150–400)
RBC: 2.7 MIL/uL — ABNORMAL LOW (ref 3.87–5.11)
RDW: 18.5 % — ABNORMAL HIGH (ref 11.5–15.5)
WBC: 11.2 10*3/uL — ABNORMAL HIGH (ref 4.0–10.5)

## 2016-03-27 LAB — CULTURE, RESPIRATORY

## 2016-03-28 LAB — CULTURE, BLOOD (ROUTINE X 2)
Culture: NO GROWTH
Culture: NO GROWTH

## 2016-04-16 DEATH — deceased

## 2016-04-20 ENCOUNTER — Telehealth: Payer: Self-pay | Admitting: Neurology

## 2016-04-20 NOTE — Telephone Encounter (Signed)
Reviewed

## 2016-04-21 ENCOUNTER — Ambulatory Visit: Payer: Medicare Other | Admitting: Neurology

## 2016-07-22 ENCOUNTER — Ambulatory Visit: Admitting: Vascular Surgery

## 2016-07-22 ENCOUNTER — Other Ambulatory Visit (HOSPITAL_COMMUNITY)

## 2016-12-24 IMAGING — CT CT ABD-PELV W/ CM
2 of 5 series · 14 of 46 positions shown, 16 images · IV contrast (Omni 300)
Comparison: CT abdomen dated 01/31/2016.

CLINICAL DATA: Follow-up, unable to obtain history from patient.
TECHNIQUE: Multidetector CT imaging of the abdomen and pelvis was performed
using the standard protocol following bolus administration of
intravenous contrast.

CONTRAST:  100mL E8OTMT-277 IOPAMIDOL (E8OTMT-277) INJECTION 61%

[Series 2: a/p w/ 5mm · axial · 0.66mm/px · z∈[+660,+1084]mm · 11 of 97 slices shown, 13 images]
[im 6/97  soft-tissue]
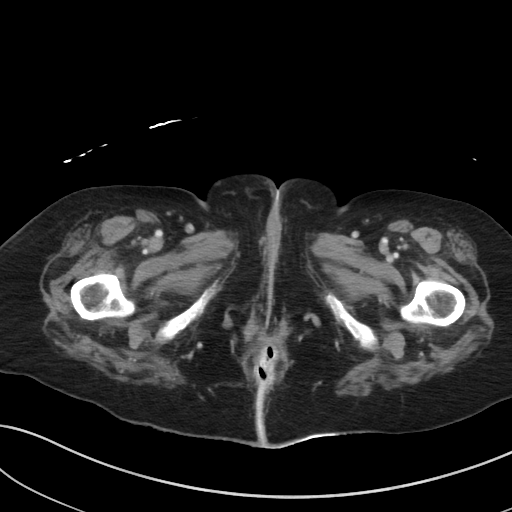
[im 6/97  bone]
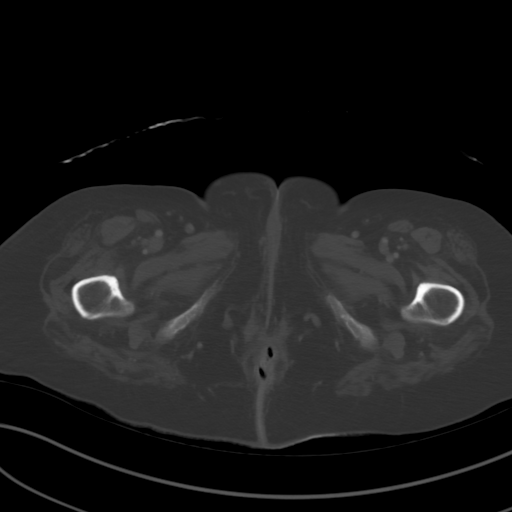
[im 16/97  soft-tissue]
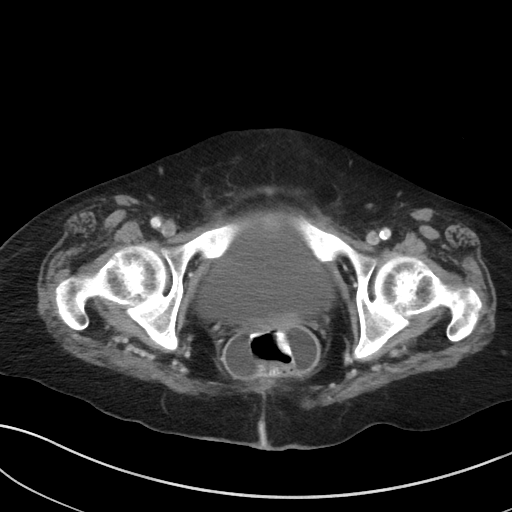
[im 26/97  soft-tissue]
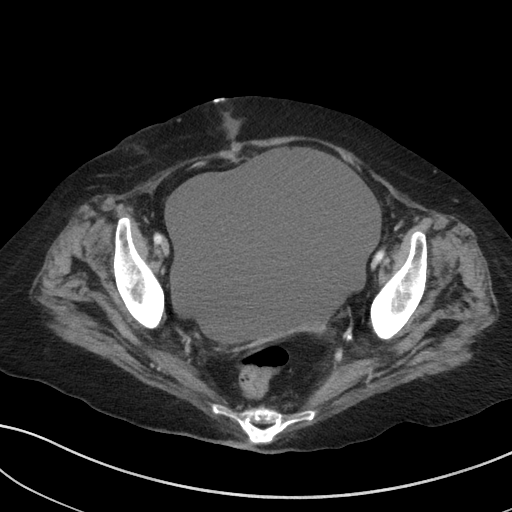
[im 31/97  soft-tissue]
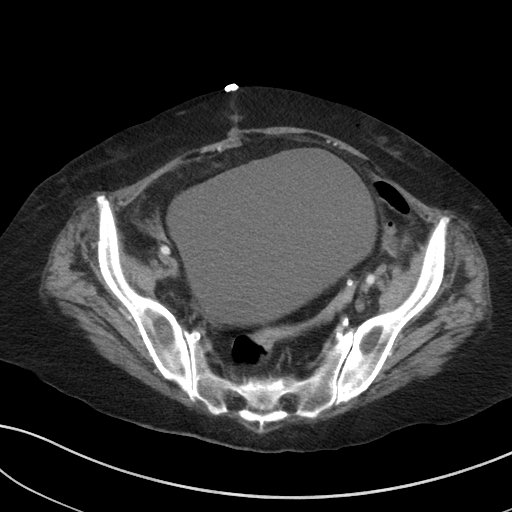
[im 41/97  soft-tissue]
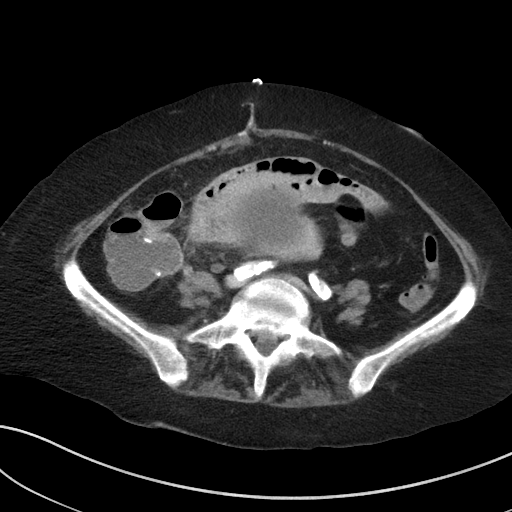
[im 51/97  soft-tissue]
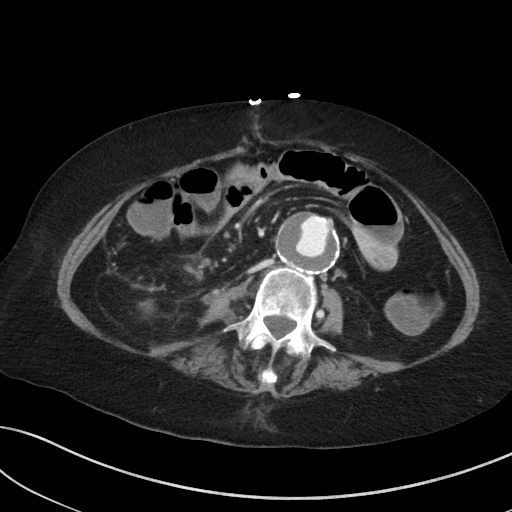
[im 56/97  soft-tissue]
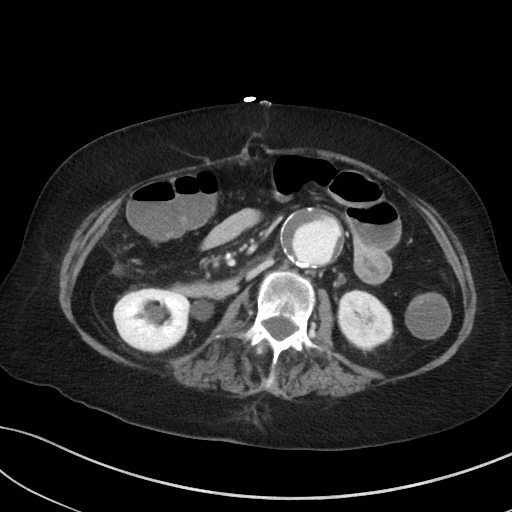
[im 66/97  soft-tissue]
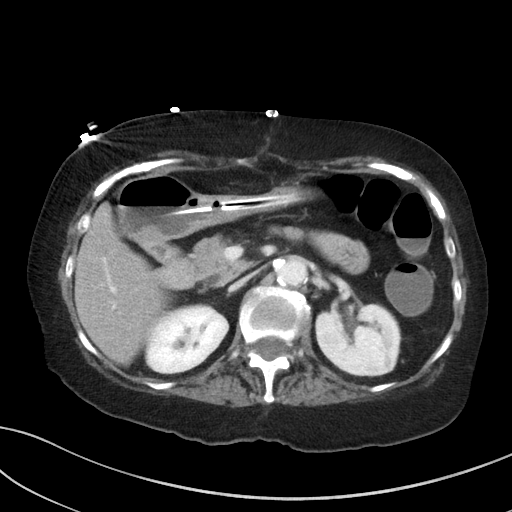
[im 71/97  soft-tissue]
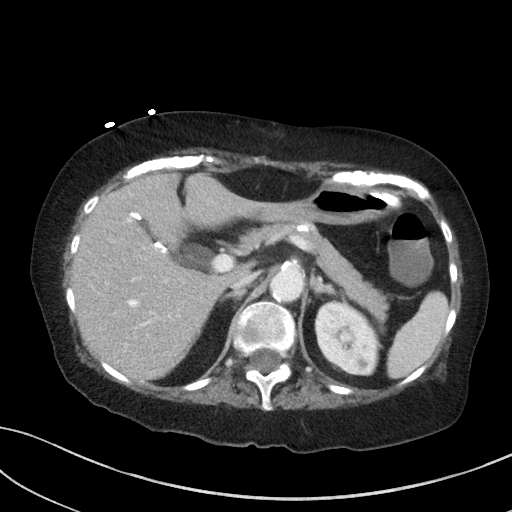
[im 71/97  bone]
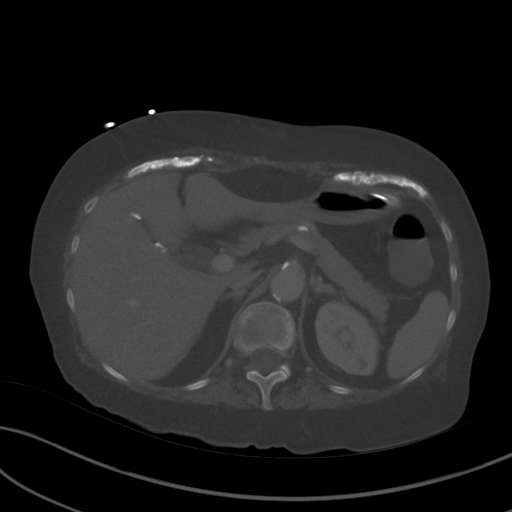
[im 81/97  soft-tissue]
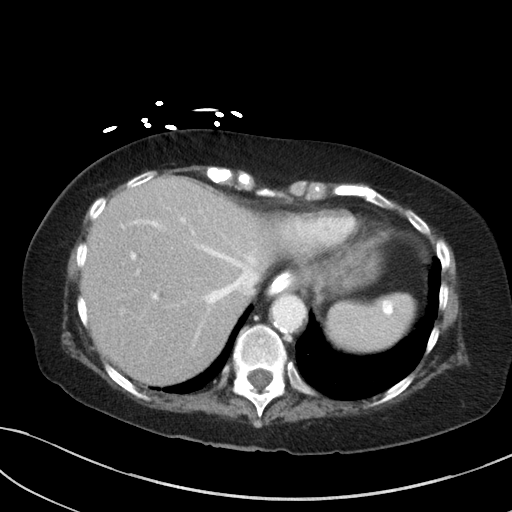
[im 91/97  soft-tissue]
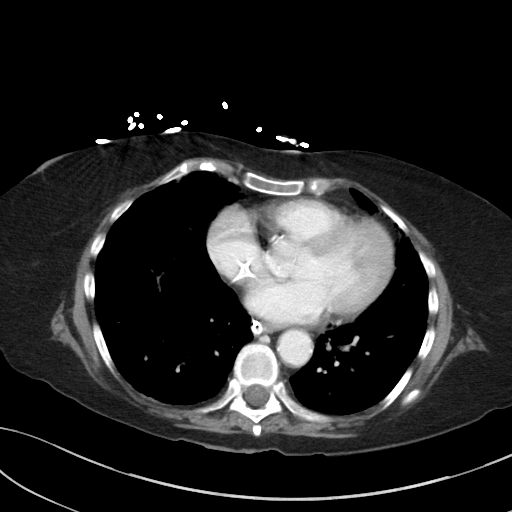

[Series 5: a/p w/ cor · coronal · 0.64mm/px · 3 of 128 slices shown]
[im 43/128  soft-tissue]
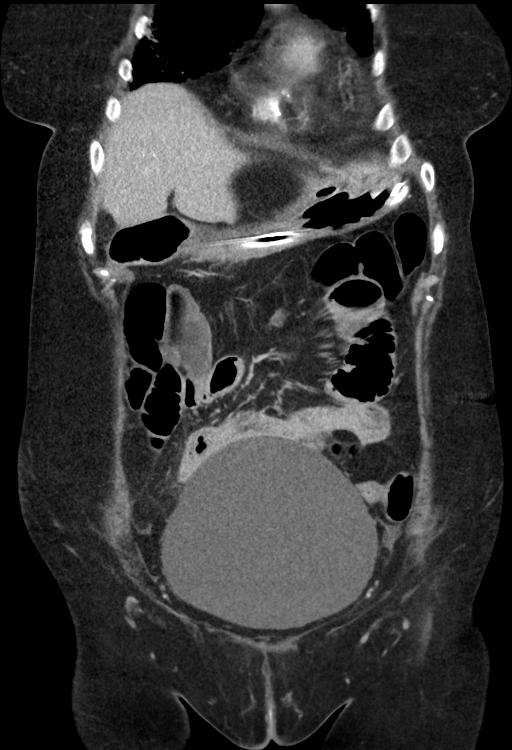
[im 57/128  soft-tissue]
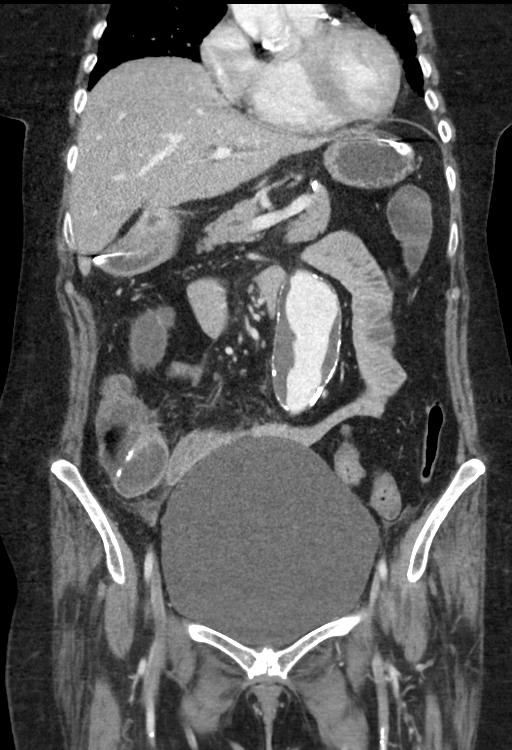
[im 71/128  soft-tissue]
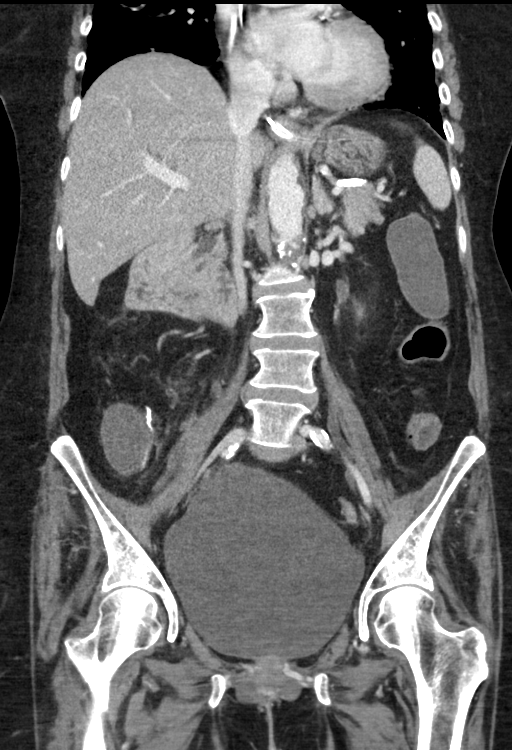

[14 of 46 positions shown; findings below may reference images not displayed]

CT abdomen of 01/31/2016 described a markedly abnormal loop of small
bowel within the right lower quadrant with extensive wall
thickening, associated mesenteric edema and right lower quadrant
free fluid. Short-term follow-up CT was also recommended for an
indeterminate soft tissue within the right lower quadrant small
bowel mesentery.

EXAM:
CT ABDOMEN AND PELVIS WITH CONTRAST
FINDINGS: Lower chest: Combination of atelectasis and probable fibrosis at
each lung base.

Hepatobiliary: Status post cholecystectomy.  Liver appears normal.

Pancreas: Pancreas is unremarkable.

Spleen: Within normal limits in size and appearance.

Adrenals/Urinary Tract: Bladder is moderately distended. Small
amount of air within the bladder is likely related to recent
catheterization. There is mild bilateral hydronephrosis due to the
bladder distension. Kidneys otherwise unremarkable. No renal stone
or perinephric fluid. Adrenal glands are unremarkable.

Stomach/Bowel: Enteric tube appears adequately positioned in the
stomach with tip at the level of the duodenal bulb. Interval
resolution of the small bowel dilatation. Large bowel is normal in
caliber. There are surgical changes of interval partial bowel
resection with anastomosis in the right lower quadrant. No
obstruction, fluid collection or other surgical complicating
feature.

Vascular/Lymphatic: Atherosclerotic changes throughout the abdominal
aorta and pelvic vasculature. No change in size of the infrarenal
abdominal aortic aneurysm in the short-term interval, again
measuring 4.1 x 4 cm. No periaortic hemorrhage or edema.

Reproductive: No mass or other significant abnormality.

Other: Stable appearance of the soft tissue density material in the
right lower quadrant mesentery, measuring approximately 1.3 x 1 cm.
Additional nodular density within a slightly more inferior and
lateral portion of the right lower quadrant mesentery is new
compared to the previous study and measures 1.6 x 1.2 cm.

No abscess collection or significant free fluid. No free
intraperitoneal air.

Musculoskeletal: Mild degenerative change throughout the slightly
scoliotic thoracolumbar spine. No acute or suspicious osseous
lesion.

Surgical staples are seen along the anterior abdominal wall in the
midline. Superficial soft tissues otherwise unremarkable.
IMPRESSION: 1. Bladder is prominently distended causing mild bilateral
hydronephrosis. Bladder outlet obstruction?
2. Status post interval partial bowel resection with anastomosis in
the right lower quadrant. There is now no bowel dilatation. No
evidence of surgical complicating feature.
3. Stable appearance of the nodular soft tissue density material in
the right lower quadrant mesentery, measuring 1.3 x 1 cm. This
remains indeterminate but I favor mildly prominent reactive lymph
node. Other differential considerations would include mesenteric
carcinoid or neoplastic/metastatic implant. Recommend follow-up CT
in 3 months to ensure stability or resolution.
4. New similar-appearing nodular soft tissue density material within
a slightly more inferior and lateral portion of the right lower
quadrant mesentery, measuring 1.6 x 1.2 cm. This is likely expected
postsurgical change and/or new reactive lymph node. Recommend
attention to this area on the follow-up CT in 3 months as
recommended above.
5. Abdominal aortic aneurysm is stable in the short-term interval.
Please see recommendations from the previous CT.
6. Aortic atherosclerosis.

## 2017-01-15 IMAGING — CR DG CHEST 1V PORT
1 series · 1 of 1 positions shown · non-contrast
Comparison: 03/01/2016

CLINICAL DATA: Pneumonia

EXAM:
PORTABLE CHEST 1 VIEW

[AP]
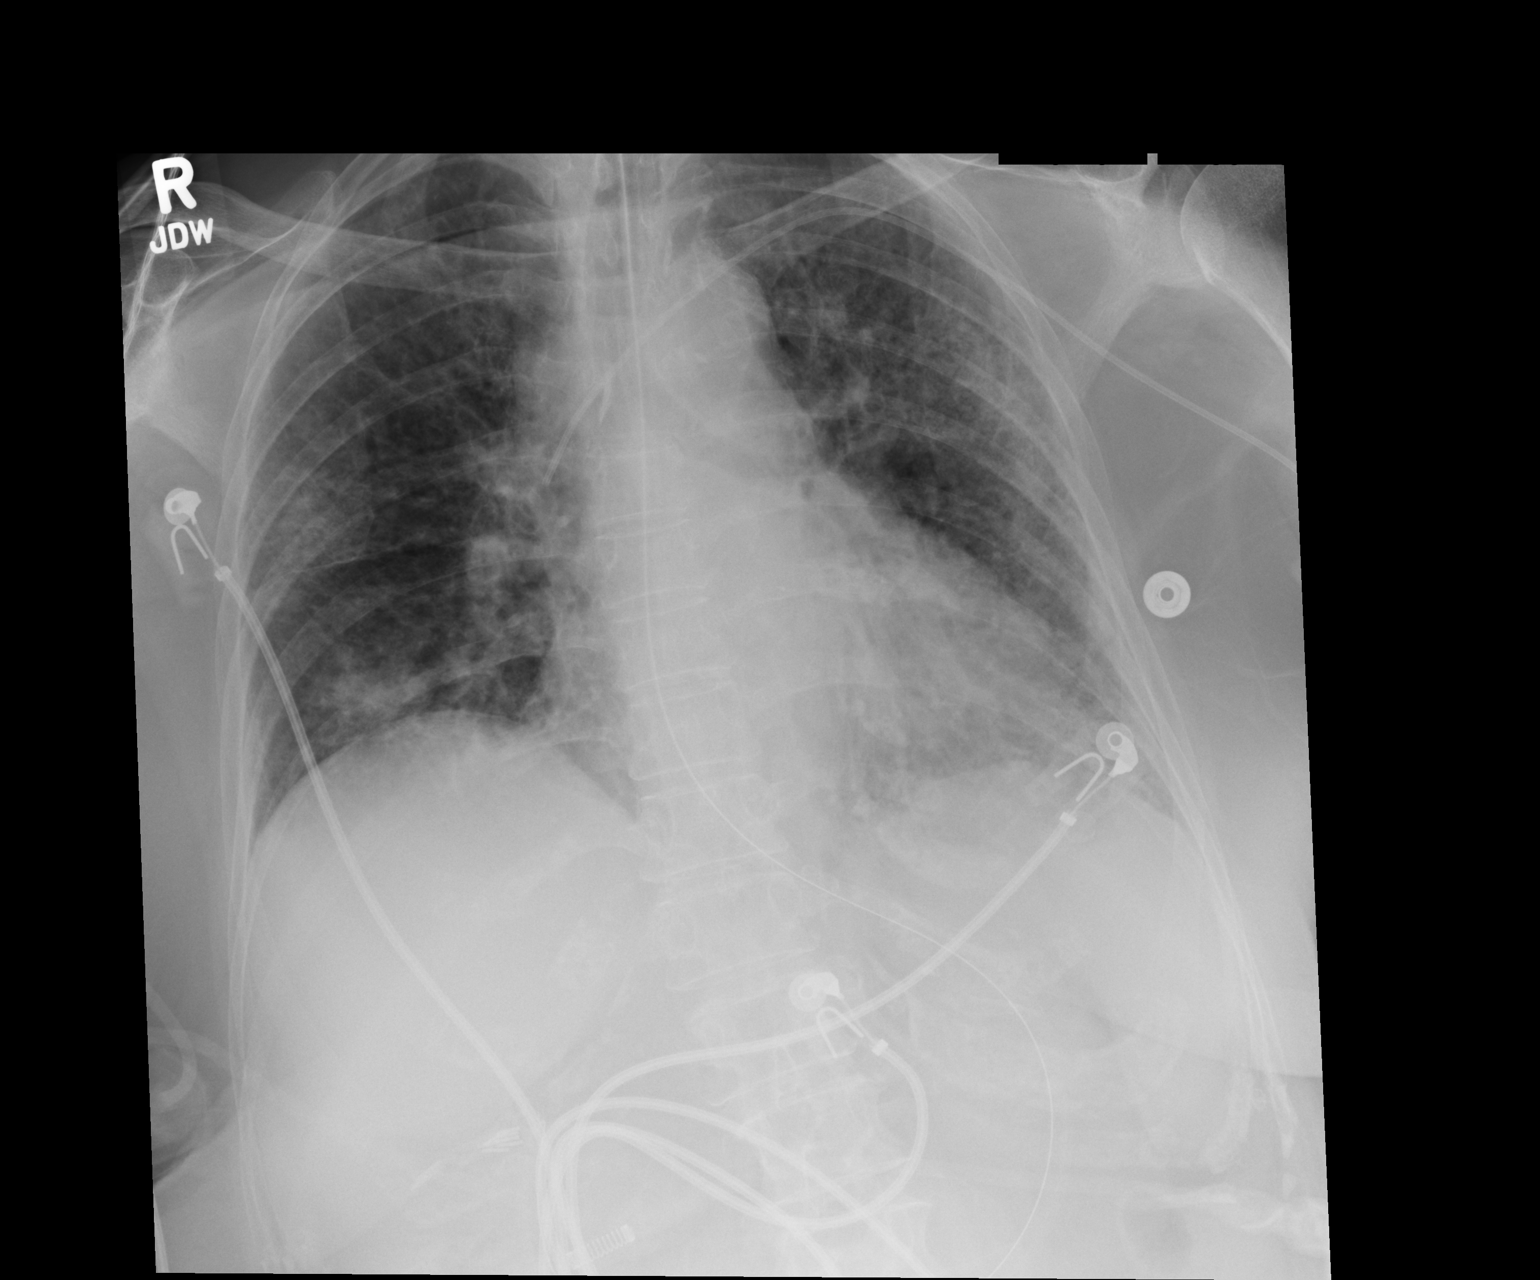

[1 of 1 positions shown; findings below may reference images not displayed]

FINDINGS: Mild patchy right lower lobe opacity, suspicious for pneumonia.

Additional chronic subpleural reticulation/ fibrosis, upper lobe
predominant, suggesting chronic interstitial lung disease. No
pleural effusion or pneumothorax.

The heart is top-normal in size.

Left arm PICC terminates in the lower SVC.

Enteric tube terminates in the distal gastric body.
IMPRESSION: Mild patchy right lower lobe opacity, suspicious for pneumonia.

Suspected chronic interstitial lung disease.

## 2017-01-16 IMAGING — CR DG CHEST 1V PORT
1 series · 1 of 1 positions shown · non-contrast
Comparison: 03/11/2016

CLINICAL DATA: Intubation adjustment.

EXAM:
PORTABLE CHEST 1 VIEW

[AP]
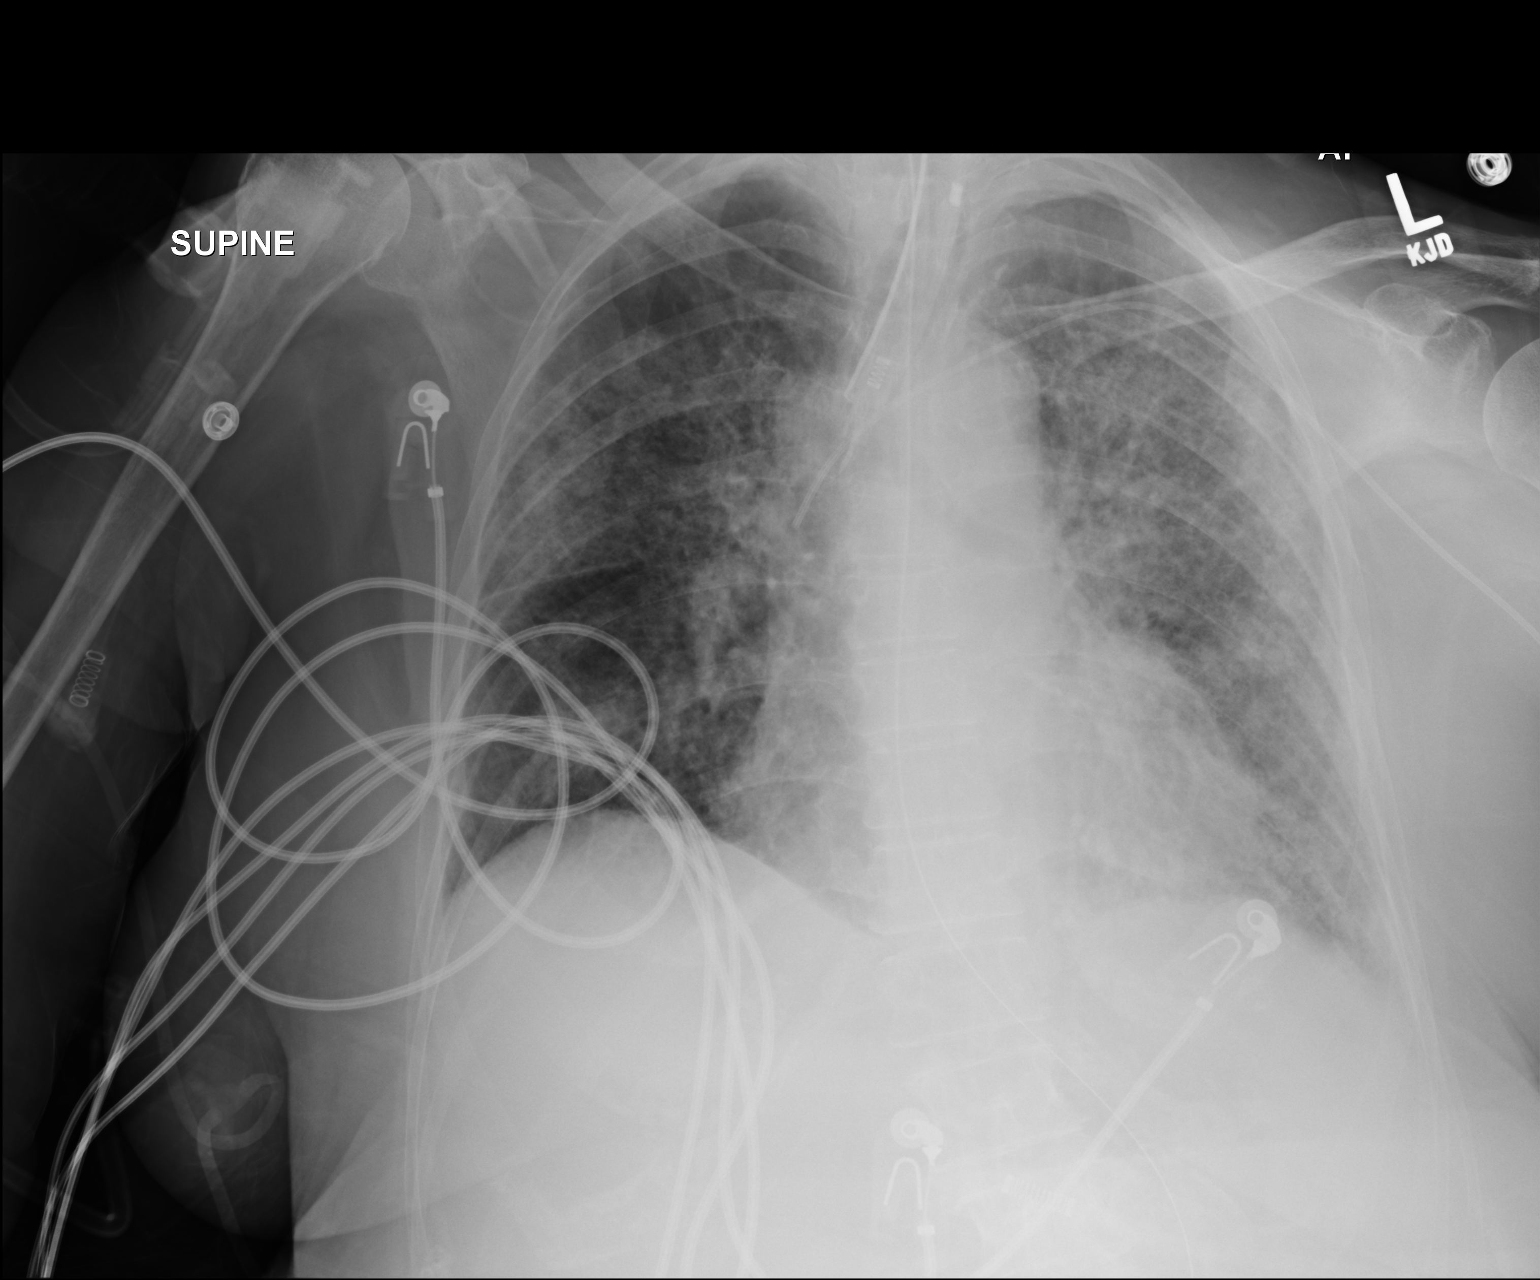

[1 of 1 positions shown; findings below may reference images not displayed]

FINDINGS: An endotracheal tube is in place with tip 1.5 cm above the carina,
repositioned. Nasogastric tube tip is off the image beyond the
gastroesophageal junction. A left-sided PICC line tip overlies the
superior vena cava. Heart size is upper normal. Patchy infiltrates
are identified throughout the lungs bilaterally and appear stable.
IMPRESSION: Repositioned endotracheal tube, tip 1.5 cm above the carina.
Persistent bilateral pulmonary infiltrates.

## 2017-01-22 IMAGING — DX DG CHEST 1V PORT
1 series · 1 of 1 positions shown · non-contrast
Comparison: 03/12/2016, 03/11/2016

CLINICAL DATA: Respiratory failure, COPD, sepsis

EXAM:
PORTABLE CHEST 1 VIEW

[chest ap]
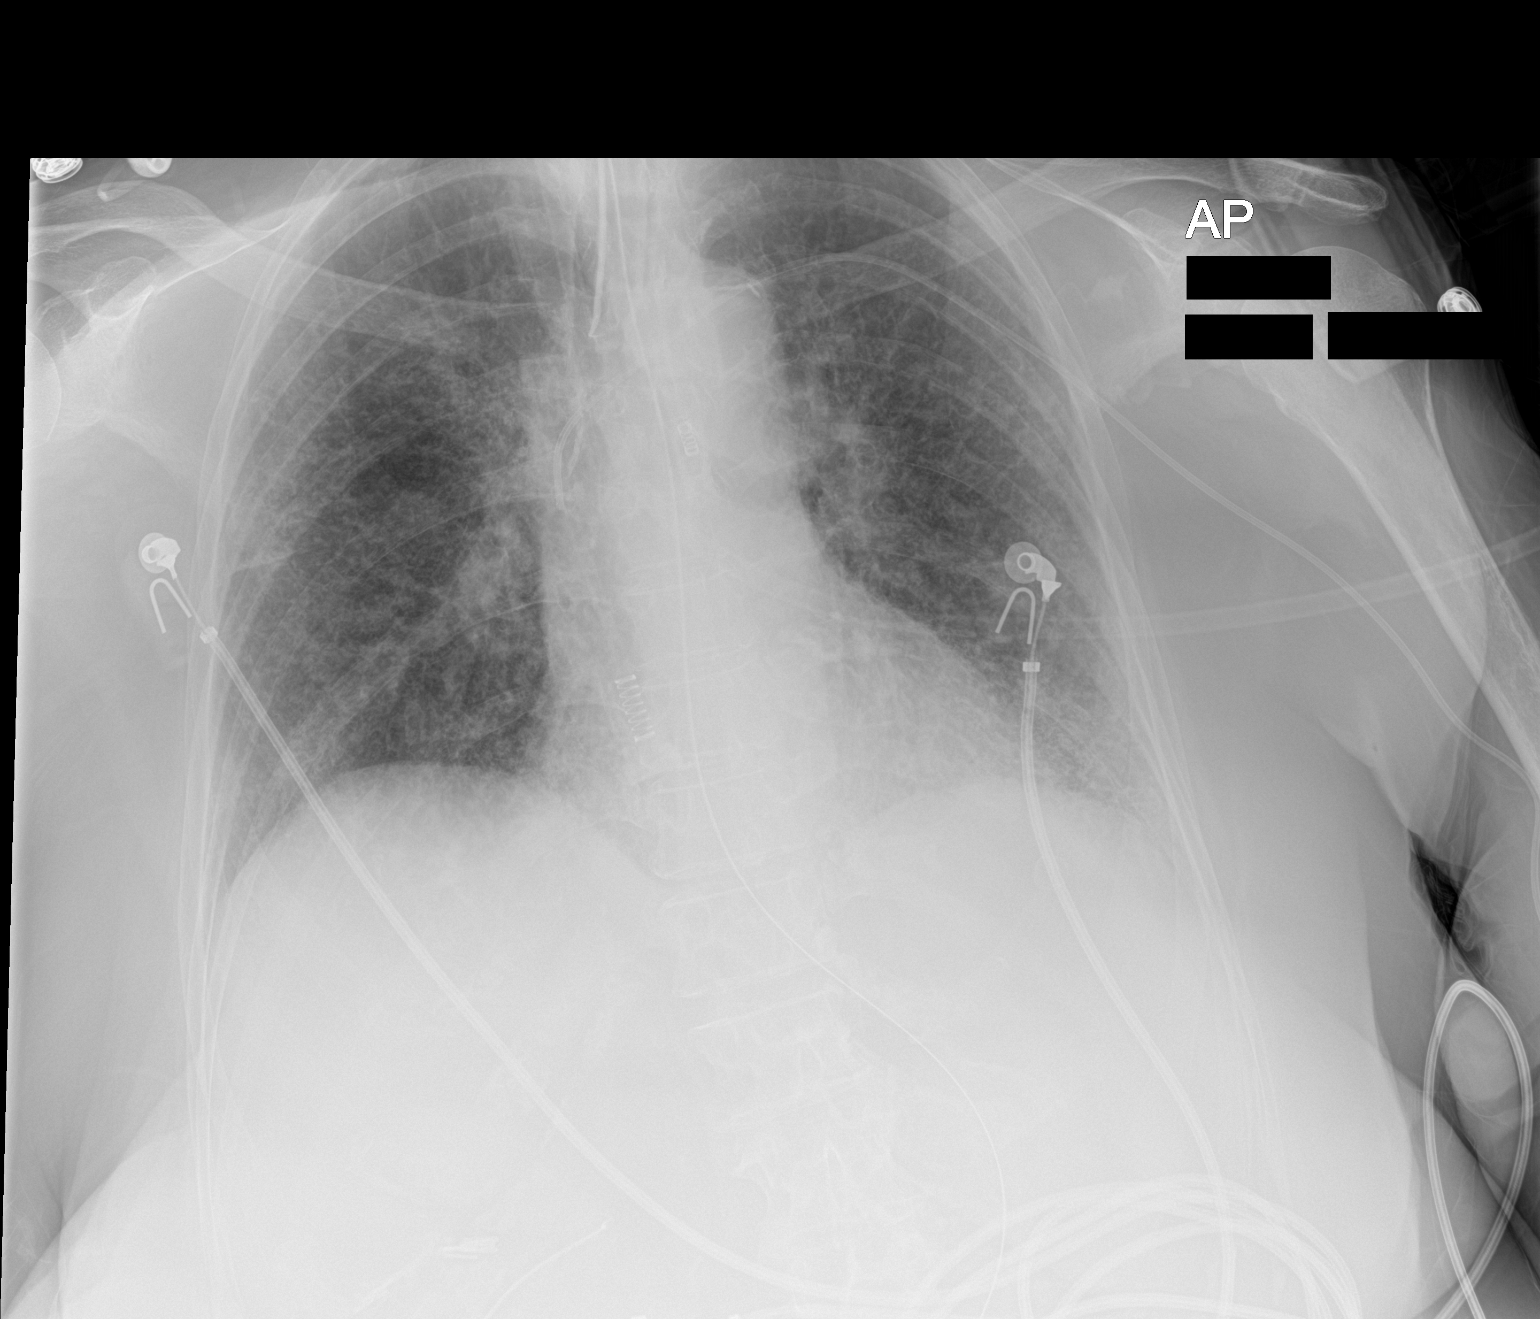

[1 of 1 positions shown; findings below may reference images not displayed]

FINDINGS: Endotracheal tube 3.5 cm above the carina. Left PICC line tip
proximal SVC. NG tube within the distal stomach. Atherosclerosis
noted of the aorta.

Interval improvement in the patchy asymmetric bilateral airspace
process versus pneumonia. Diffuse vascular and interstitial
prominence may represent a component of residual edema. No enlarging
effusion or pneumothorax.
IMPRESSION: Improving bilateral asymmetric airspace process versus pneumonia.

Residual background interstitial edema pattern

Thoracic aortic atherosclerosis

## 2017-01-27 IMAGING — DX DG CHEST 1V PORT
1 series · 1 of 1 positions shown · non-contrast
Comparison: 03/17/2016

CLINICAL DATA: Respiratory failure.  History of COPD.

EXAM:
PORTABLE CHEST 1 VIEW

[chest ap]
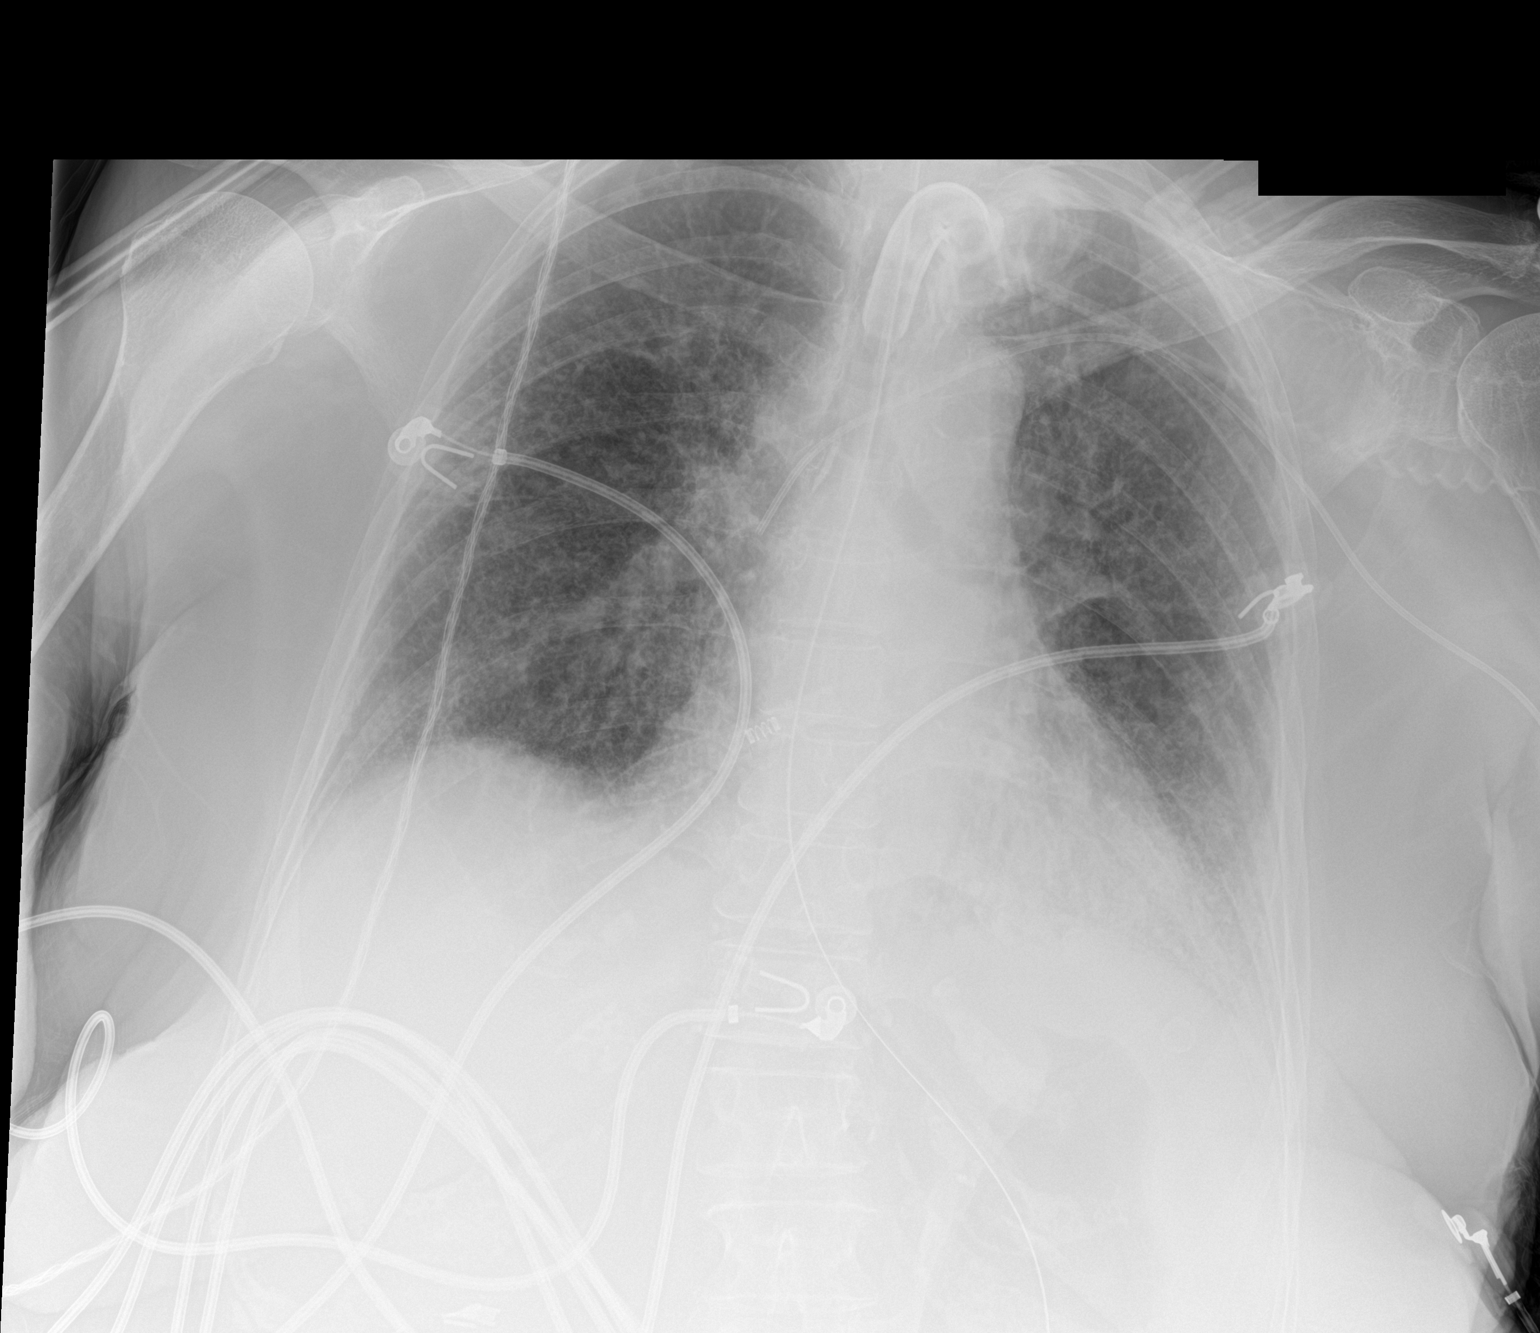

[1 of 1 positions shown; findings below may reference images not displayed]

FINDINGS: There has been interval placement of tracheostomy tube with tip
cm from the carina. Left PICC line is unchanged. Enteric catheter
tip is collimated off the image.

Cardiomediastinal silhouette is normal. Mediastinal contours appear
intact. Atherosclerotic disease of the aorta noted.

There is no evidence of pneumothorax. Bilateral patchy alveolar and
interstitial opacities persist.

Osseous structures are without acute abnormality. Soft tissues are
grossly normal.
IMPRESSION: Post placement of tracheostomy tube in satisfactory radiographic
position. No evidence of pneumothorax.

Persistent bilateral patchy alveolar and interstitial opacities
which may represent asymmetric pulmonary edema versus infectious
consolidation.

## 2017-03-21 ENCOUNTER — Telehealth: Payer: Self-pay | Admitting: Cardiology

## 2017-03-21 NOTE — Telephone Encounter (Signed)
error 

## 2017-04-26 ENCOUNTER — Telehealth: Payer: Self-pay | Admitting: *Deleted

## 2017-04-26 NOTE — Telephone Encounter (Signed)
Rx fentanyl printed on 01/28/16 destroyed d/t patient never picking up from office.
